# Patient Record
Sex: Male | Born: 1950 | Race: White | Hispanic: No | Marital: Married | Smoking: Current every day smoker
Health system: Southern US, Community
[De-identification: ages and names within clinical notes are randomized; demographics above are authoritative.]

## PROBLEM LIST (undated history)

## (undated) ENCOUNTER — Emergency Department (HOSPITAL_BASED_OUTPATIENT_CLINIC_OR_DEPARTMENT_OTHER): Payer: Self-pay | Source: Home / Self Care

## (undated) DIAGNOSIS — E119 Type 2 diabetes mellitus without complications: Secondary | ICD-10-CM

## (undated) DIAGNOSIS — M1990 Unspecified osteoarthritis, unspecified site: Secondary | ICD-10-CM

## (undated) DIAGNOSIS — G709 Myoneural disorder, unspecified: Secondary | ICD-10-CM

## (undated) DIAGNOSIS — K219 Gastro-esophageal reflux disease without esophagitis: Secondary | ICD-10-CM

## (undated) HISTORY — PX: HERNIA REPAIR: SHX51

## (undated) HISTORY — PX: EYE SURGERY: SHX253

---

## 1999-04-06 ENCOUNTER — Encounter: Payer: Self-pay | Admitting: Internal Medicine

## 1999-04-06 ENCOUNTER — Ambulatory Visit (HOSPITAL_COMMUNITY): Admission: RE | Admit: 1999-04-06 | Discharge: 1999-04-06 | Payer: Self-pay | Admitting: Internal Medicine

## 2001-05-29 ENCOUNTER — Ambulatory Visit (HOSPITAL_COMMUNITY): Admission: RE | Admit: 2001-05-29 | Discharge: 2001-05-29 | Payer: Self-pay | Admitting: Gastroenterology

## 2001-05-29 ENCOUNTER — Encounter (INDEPENDENT_AMBULATORY_CARE_PROVIDER_SITE_OTHER): Payer: Self-pay | Admitting: Specialist

## 2007-10-08 ENCOUNTER — Ambulatory Visit (HOSPITAL_BASED_OUTPATIENT_CLINIC_OR_DEPARTMENT_OTHER): Admission: RE | Admit: 2007-10-08 | Discharge: 2007-10-08 | Payer: Self-pay | Admitting: General Surgery

## 2007-10-08 ENCOUNTER — Encounter (INDEPENDENT_AMBULATORY_CARE_PROVIDER_SITE_OTHER): Payer: Self-pay | Admitting: General Surgery

## 2011-01-02 NOTE — Op Note (Signed)
Christopher Daniel, Christopher Daniel               ACCOUNT NO.:  [id number]   MEDICAL RECORD NO.:  [id number]          PATIENT TYPE:  AMB   LOCATION:  [facility]                         FACILITY:  [facility]   PHYSICIAN:  Anselm Pancoast. Weatherly, M.D.DATE OF BIRTH:  [birth year]   DATE OF PROCEDURE:  10/08/2007  DATE OF DISCHARGE:                               OPERATIVE REPORT   PREOPERATIVE DIAGNOSES:  Left inguinal hernia, probably direct.   POSTOPERATIVE DIAGNOSES:  Left inguinal hernia that is a combination of  direct and indirect.   REFERRING PHYSICIAN:  Joycelyn Rua, M.D.   OPERATION:  Left inguinal herniorrhaphy with mesh reinforcement.   ANESTHESIA:  General anesthesia with LOA tube.   SUBJECTIVE:  Dr. Consuello Bossier.   HISTORY:  Christopher Daniel is a 60 year old, quite tall male who was  referred to me with symptomatic left inguinal hernia. He stated recently  he has had sort of a cold and was doing significant coughing and then  noticed a pain in the left inguinal hernia. He is retired from [employer] and  presently spends most of his time fishing, hunting and other activities  and he said he was pulling a deer in from a hunting expedition when he  started having more significant pain in the left groin. In the office on  exam, you could feel a definite left inguinal hernia, I thought it was  probably a direct hernia. He has also had problems with an anal fissure  for which he is on __________  Xylocaine ointment and that is improving.   Preoperatively the patient was given a gram of Ancef and taken to the  operative suite. An LOA tube was placed and anesthesia, the left groin  had been marked. The time out was identified with the patient,  antibiotics, planned procedure, etc. I had clipped the pubic hair and  then he was prepped with Betadine solution and draped in a sterile  manner. The ilioinguinal area was first infiltrated with a blunted 22  gauge needle with about 10 mL of 0.5% Xylocaine and 0.25  Marcaine with  adrenaline. The inguinal incision area was infiltrated and then the skin  and subcutaneous tissue was opened and there was a large superficial  vein that was clamped, divided and ligated with 4-0 Vicryl. The external  oblique aponeurosis was identified in the external ring area and you  could see the ilioinguinal nerve area. I opened the external oblique  taking care that the ilioinguinal nerve was not stretched or divided and  then elevated the cord structures at the symphysis pubic area. There was  a little bit of fatty tissue preperitoneal that had herniated down. You  could see that this looks like he has got an indirect component as well  as a direct defect. I carefully separated the indirect hernia sac from  the cord structures and opened it. There was a little loop of bowel that  was kind of adherent at the neck of the sac and this was freed and then  a high sac ligation of 2-0 Surgilon was placed just distal to where I  had freed  the bowel and then the hernia sac ligated under direct vision.  The second suture of 2-0 Vicryl was placed just distal and then the  hernia sac was removed and it retracted back up in the preperitoneal  space nicely. The patient has a moderate size direct component and the  floor was closed suturing the shelving edge of the inguinal ligament  with a conjoined tendon area with a running 2-0 Prolene going back to  suturing the 2 ends together recreating the internal ring. The floor was  then anesthetized with the combination of anesthetic solution for  postoperative pain. I then used a piece of Prolene mesh shaped like a  seal, slit laterally to reinforce the floor starting at the symphysis  pubic area, suturing to the inguinal ligament inferior with a running 2-  0 Vicryl and the two tails that been placed around the cord structures  were left large enough that they should not cause any compression. The  superior flap was sutured down with  interrupted sutures of 2-0 Prolene  and the mesh was lying flat not under excessive tension. Next the cord  structures were placed back in the inguinal canal and the external  oblique was closed with a running 2-0 Vicryl. Scarpa's fascia was closed  with interrupted 3-0 Vicryl. There was a little lipoma of the cord that  I separated from the cord structures under direct vision making sure  there was no compromise of the vascular structures. I then closed the  subcuticular with 4-0 Vicryl, Benzoin and Steri-Strips on the skin. The  patient tolerated the procedure nicely and the testicle was in its  normal position. He will be released after a short stay in the recovery  room and I will see him back in the office in approximately 2 weeks.           ______________________________  Anselm Pancoast. Zachery Dakins, M.D.     WJW/MEDQ  D:  10/08/2007  T:  10/09/2007  Job:  [id number]   cc:   Joycelyn Rua, M.D.  Fax: [phone]

## 2011-01-05 NOTE — Procedures (Signed)
[facility]  Patient:    Christopher Daniel, Christopher Daniel Visit Number: [id number] MRN: [id number]          Service Type: END Location: ENDO Attending Physician:  Charna Elizabeth Dictated by:   Anselmo Rod, M.D. Proc. Date: 05/29/01 Admit Date:  05/29/2001   CC:         Lilly Cove, M.D.   Procedure Report  DATE OF BIRTH:  1951-03-10  REFERRING PHYSICIAN:  Lilly Cove, M.D.  PROCEDURE PERFORMED:  Colonoscopy with hot biopsies x 10.  ENDOSCOPIST:  Anselmo Rod, M.D.  INSTRUMENT USED:  Olympus video colonoscope.  INDICATIONS FOR PROCEDURE:  The patient is a 60 year old white male with a history of blood in stool rule out colonic polyps, masses, hemorrhoids, etc.  PREPROCEDURE PREPARATION:  Informed consent was procured from the patient. The patient was fasted for eight hours prior to the procedure and prepped with a bottle of magnesium citrate and a gallon of NuLytely the night prior to the procedure.  PREPROCEDURE PHYSICAL:  The patient had stable vital signs.  Neck supple. Chest clear to auscultation.  S1, S2 regular.  Abdomen soft with normal abdominal bowel sounds.  DESCRIPTION OF PROCEDURE:  The patient was placed in the left lateral decubitus position and sedated with 70 mg of Demerol and 10 mg of Versed intravenously.  Once the patient was adequately sedated and maintained on low-flow oxygen and continuous cardiac monitoring, the Olympus video colonoscope was advanced from the rectum to the cecum without difficulty.  The patient had multiple small sessile polyps throughout the left colon.  These were removed by hot biopsy forceps x 10.  No large masses were seen.  There was no evidence of diverticulosis or hemorrhoids.  The patient tolerated the procedure well without complication.  IMPRESSION: 1. Multiple left-sided colonic polyps (very small sessile polyps) and hot    biopsies done. 2. No large masses seen. 3. No evidence of  diverticulosis.  RECOMMENDATIONS: 1. Await pathology results. 2. Avoid all nonsteroidals for the next two weeks. 3. A high fiber diet. 4. Repeat colorectal cancer screening depending on the pathology results. 5. Outpatient follow-up in the next two weeks. Dictated by:   Anselmo Rod, M.D. Attending Physician:  Charna Elizabeth DD:  05/29/01 TD:  05/29/01 Job: [id number]

## 2011-05-11 LAB — COMPREHENSIVE METABOLIC PANEL
ALT: 19
AST: 15
Albumin: 3.7
Alkaline Phosphatase: 73
BUN: 9
CO2: 27
Calcium: 9.1
Chloride: 104
Creatinine, Ser: 0.97
GFR calc Af Amer: >60
GFR calc non Af Amer: >60
Glucose, Bld: 117 — ABNORMAL HIGH
Potassium: 4
Sodium: 138
Total Bilirubin: 0.7
Total Protein: 6.5

## 2011-05-11 LAB — CBC
HCT: 46.7
Hemoglobin: 15.9
MCHC: 34.1
MCV: 92.3
Platelets: 299
RBC: 5.06
RDW: 14
WBC: 11.6 — ABNORMAL HIGH

## 2011-05-11 LAB — DIFFERENTIAL
Basophils Absolute: 0
Basophils Relative: 0
Eosinophils Absolute: 0.1
Eosinophils Relative: 1
Lymphocytes Relative: 19
Lymphs Abs: 2.2
Monocytes Absolute: 0.3
Monocytes Relative: 3
Neutro Abs: 8.9 — ABNORMAL HIGH
Neutrophils Relative %: 77

## 2013-03-04 ENCOUNTER — Other Ambulatory Visit: Payer: Self-pay | Admitting: Sports Medicine

## 2013-03-04 ENCOUNTER — Ambulatory Visit (INDEPENDENT_AMBULATORY_CARE_PROVIDER_SITE_OTHER): Payer: BC Managed Care – PPO

## 2013-03-04 DIAGNOSIS — M25551 Pain in right hip: Secondary | ICD-10-CM

## 2013-03-04 DIAGNOSIS — M169 Osteoarthritis of hip, unspecified: Secondary | ICD-10-CM

## 2013-03-04 DIAGNOSIS — M25559 Pain in unspecified hip: Secondary | ICD-10-CM

## 2020-02-03 ENCOUNTER — Other Ambulatory Visit: Payer: Self-pay | Admitting: Neurosurgery

## 2020-02-08 NOTE — Progress Notes (Signed)
[facility] [phone] - [place] - [address] [place] [address] Phone: [phone] Fax: [phone]      Your procedure is scheduled on February 11, 2020.  Report to [facility] Main Entrance "A" at 06:00 A.M., and check in at the Admitting office.  Call this number if you have problems the morning of surgery:  [phone]  Call [phone] if you have any questions prior to your surgery date Monday-Friday 8am-4pm   Remember:  Do not eat or drink after midnight the night before your surgery    Take these medicines the morning of surgery with A SIP OF WATER : NONE  As of today, STOP taking any Aspirin (unless otherwise instructed by your surgeon) and Aspirin containing products, Aleve, Naproxen, Ibuprofen, Motrin, Advil, Goody's, BC's, all herbal medications, fish oil, and all vitamins.                     Do not wear jewelry.            Do not wear lotions, powders, colognes, or deodorant.            Do not shave 48 hours prior to surgery.             Do not bring valuables to the hospital.            [facility] is not responsible for any belongings or valuables.  Do NOT Smoke (Tobacco/Vapping) or drink Alcohol 24 hours prior to your procedure If you use a CPAP at night, you may bring all equipment for your overnight stay.   Contacts, glasses, dentures or bridgework may not be worn into surgery.      For patients admitted to the hospital, discharge time will be determined by your treatment team.   Patients discharged the day of surgery will not be allowed to drive home, and someone needs to stay with them for 24 hours.    Special instructions:   [place]- Preparing For Surgery  Before surgery, you can play an important role. Because skin is not sterile, your skin needs to be as free of germs as possible. You can reduce the number of germs on your skin by washing with CHG (chlorahexidine gluconate) Soap before surgery.  CHG is an  antiseptic cleaner which kills germs and bonds with the skin to continue killing germs even after washing.    Oral Hygiene is also important to reduce your risk of infection.  Remember - BRUSH YOUR TEETH THE MORNING OF SURGERY WITH YOUR REGULAR TOOTHPASTE  Please do not use if you have an allergy to CHG or antibacterial soaps. If your skin becomes reddened/irritated stop using the CHG.  Do not shave (including legs and underarms) for at least 48 hours prior to first CHG shower. It is OK to shave your face.  Please follow these instructions carefully.   1. Shower the NIGHT BEFORE SURGERY and the MORNING OF SURGERY with CHG Soap.   2. If you chose to wash your hair, wash your hair first as usual with your normal shampoo.  3. After you shampoo, rinse your hair and body thoroughly to remove the shampoo.  4. Use CHG as you would any other liquid soap. You can apply CHG directly to the skin and wash gently with a scrungie or a clean washcloth.   5. Apply the CHG Soap to your body ONLY FROM THE NECK DOWN.  Do not use on open wounds  or open sores. Avoid contact with your eyes, ears, mouth and genitals (private parts). Wash Face and genitals (private parts)  with your normal soap.   6. Wash thoroughly, paying special attention to the area where your surgery will be performed.  7. Thoroughly rinse your body with warm water from the neck down.  8. DO NOT shower/wash with your normal soap after using and rinsing off the CHG Soap.  9. Pat yourself dry with a CLEAN TOWEL.  10. Wear CLEAN PAJAMAS to bed the night before surgery, wear comfortable clothes the morning of surgery  11. Place CLEAN SHEETS on your bed the night of your first shower and DO NOT SLEEP WITH PETS.   Day of Surgery:   Do not apply any deodorants/lotions.  Please wear clean clothes to the hospital/surgery center.   Remember to brush your teeth WITH YOUR REGULAR TOOTHPASTE.   Please read over the following fact sheets that  you were given.

## 2020-02-09 ENCOUNTER — Encounter (HOSPITAL_COMMUNITY)
Admission: RE | Admit: 2020-02-09 | Discharge: 2020-02-09 | Disposition: A | Discharge status: Home / Self Care | Payer: Medicare HMO | Source: Ambulatory Visit | Attending: Neurosurgery | Admitting: Neurosurgery

## 2020-02-09 ENCOUNTER — Other Ambulatory Visit: Payer: Self-pay

## 2020-02-09 ENCOUNTER — Encounter (HOSPITAL_COMMUNITY): Payer: Self-pay

## 2020-02-09 ENCOUNTER — Other Ambulatory Visit (HOSPITAL_COMMUNITY)
Admission: RE | Admit: 2020-02-09 | Discharge: 2020-02-09 | Disposition: A | Discharge status: Home / Self Care | Payer: Medicare HMO | Source: Ambulatory Visit | Attending: Neurosurgery | Admitting: Neurosurgery

## 2020-02-09 DIAGNOSIS — Z01818 Encounter for other preprocedural examination: Secondary | ICD-10-CM | POA: Diagnosis present

## 2020-02-09 DIAGNOSIS — Z20822 Contact with and (suspected) exposure to covid-19: Secondary | ICD-10-CM | POA: Insufficient documentation

## 2020-02-09 HISTORY — DX: Gastro-esophageal reflux disease without esophagitis: K21.9

## 2020-02-09 HISTORY — DX: Type 2 diabetes mellitus without complications: E11.9

## 2020-02-09 HISTORY — DX: Myoneural disorder, unspecified: G70.9

## 2020-02-09 HISTORY — DX: Unspecified osteoarthritis, unspecified site: M19.90

## 2020-02-09 LAB — BASIC METABOLIC PANEL
Anion gap: 10 (ref 5–15)
BUN: 13 mg/dL (ref 8–23)
CO2: 20 mmol/L — ABNORMAL LOW (ref 22–32)
Calcium: 9.2 mg/dL (ref 8.9–10.3)
Chloride: 107 mmol/L (ref 98–111)
Creatinine, Ser: 0.82 mg/dL (ref 0.61–1.24)
GFR calc Af Amer: >60 mL/min (ref >60)
GFR calc non Af Amer: >60 mL/min (ref >60)
Glucose, Bld: 111 mg/dL — ABNORMAL HIGH (ref 70–99)
Potassium: 4 mmol/L (ref 3.5–5.1)
Sodium: 137 mmol/L (ref 135–145)

## 2020-02-09 LAB — SURGICAL PCR SCREEN
MRSA, PCR: NEGATIVE
Staphylococcus aureus: NEGATIVE

## 2020-02-09 LAB — CBC WITH DIFFERENTIAL/PLATELET
Abs Immature Granulocytes: 0.03 10*3/uL (ref 0.00–0.07)
Basophils Absolute: 0 10*3/uL (ref 0.0–0.1)
Basophils Relative: 1 %
Eosinophils Absolute: 0.1 10*3/uL (ref 0.0–0.5)
Eosinophils Relative: 1 %
HCT: 46.5 % (ref 39.0–52.0)
Hemoglobin: 15.3 g/dL (ref 13.0–17.0)
Immature Granulocytes: 0 %
Lymphocytes Relative: 18 %
Lymphs Abs: 1.6 10*3/uL (ref 0.7–4.0)
MCH: 31.3 pg (ref 26.0–34.0)
MCHC: 32.9 g/dL (ref 30.0–36.0)
MCV: 95.1 fL (ref 80.0–100.0)
Monocytes Absolute: 0.5 10*3/uL (ref 0.1–1.0)
Monocytes Relative: 5 %
Neutro Abs: 6.7 10*3/uL (ref 1.7–7.7)
Neutrophils Relative %: 75 %
Platelets: 273 10*3/uL (ref 150–400)
RBC: 4.89 MIL/uL (ref 4.22–5.81)
RDW: 13.7 % (ref 11.5–15.5)
WBC: 8.9 10*3/uL (ref 4.0–10.5)
nRBC: 0 % (ref 0.0–0.2)

## 2020-02-09 LAB — SARS CORONAVIRUS 2 (TAT 6-24 HRS): SARS Coronavirus 2: NEGATIVE

## 2020-02-09 NOTE — Progress Notes (Signed)
PCP -  Lindley Magnus, PA-C Cardiologist - denies; has seen [facility] in past  Chest x-ray - N/A EKG - 02/09/20 Stress Test - 2015- [facility].- records requested ECHO - 2015- [facility] records requested Cardiac Cath - denies  Sleep Study - denies CPAP -   Fasting Blood Sugar - does not check CBG or take any medications for history of type 2 DM  Aspirin Instructions: Patient instructed to hold all Aspirin, NSAID's, herbal medications, fish oil and vitamins 7 days prior to surgery.   ERAS Protcol -N/A PRE-SURGERY Ensure or G2- N/A  COVID TEST- 02/09/20 at [facility]. Pt instructed to remain in their car. Educated on Haematologist until [employer]    Anesthesia review: records requested  Patient denies shortness of breath, fever, cough and chest pain at PAT appointment   All instructions explained to the patient, with a verbal understanding of the material. Patient agrees to go over the instructions while at home for a better understanding. Patient also instructed to self quarantine after being tested for COVID-19. The opportunity to ask questions was provided.

## 2020-02-10 NOTE — Progress Notes (Signed)
Anesthesia Chart Review:  Case: [id number] Date/Time: 02/11/20 0745   Procedure: Laminectomy for facet/synovial cyst - right - L5-S1 (Right Back) - 3C   Anesthesia type: General   Pre-op diagnosis: Synovial cyst   Location: [facility] OR ROOM 21 / [facility] OR   Surgeons: Julio Sicks, MD      DISCUSSION: Patient is a 69 year old male scheduled for the above procedure.  History includes smoking, DM2 (diet controlled), polyneuropathy, GERD.  Preoperative COVID-19 test negative on 02/09/2020.  Anesthesia team to evaluate on the day of surgery.   VS: BP 132/65   Pulse 75   Temp 37.2 C (Oral)   Resp 17   Ht 6\' 5"  (1.956 m)   Wt 93.6 kg   SpO2 97%   BMI 24.46 kg/m    PROVIDERS: , PA-C is listed as PCP. He had a visit on 01/04/20 with 01/06/20, PA-C for annual visit.  - He is not followed routinely by cardiology, but was evaluated by Betsey Holiday, MD in 2016 for chest pain and had a normal stress echo. As needed follow-up. (See [facility] Everywhere)   LABS: Labs reviewed: Acceptable for surgery. A1c 6.7% on 01/04/20 [facility] Everywhere). (all labs ordered are listed, but only abnormal results are displayed)  Labs Reviewed  BASIC METABOLIC PANEL - Abnormal; Notable for the following components:      Result Value   CO2 20 (*)    Glucose, Bld 111 (*)    All other components within normal limits  SURGICAL PCR SCREEN  CBC WITH DIFFERENTIAL/PLATELET    EKG: 02/09/20: NSR   CV: 02/11/20 Abdominal Aorta 03/10/19 ([facility]): IMPRESSION:  No evidence of aortic aneurysm   Stress Echo 10/19/14 ([facility]):  Summary: The study was technically good.  This was a normal stress echocardiogram.  No inducible ischemia.  High workload achieved.  Normal resting wall motion and no stress-induced wall motion abnormality.  Overall LV ejection fraction increased appropriately after stress.  Stress terminated secondary to achieving diagnostic endpoint with 91% Max peak heart rate achieved.  This  is an adequate stress test. Post exercise ECG demonstrated no new ST segment depression. The left ventricle is normal in size, wall thickness and wall motion with ejection fraction of 55 to 60%. The tricuspid valve leaflets are structurally normal with trace regurgitation. Mild aortic sclerosis is present with good valvular opening.   No inducible ischemia.   Past Medical History:  Diagnosis Date  . Arthritis   . Diabetes mellitus without complication (HCC)    does not take medication or check sugars  . GERD (gastroesophageal reflux disease)   . Neuromuscular disorder (HCC)    poly neuropathy    Past Surgical History:  Procedure Laterality Date  . EYE SURGERY Bilateral    eyelid  . HERNIA REPAIR      MEDICATIONS: . ibuprofen (ADVIL) 200 MG tablet  . TURMERIC PO   No current facility-administered medications for this encounter.    12/19/14, PA-C Surgical Short Stay/Anesthesiology [facility] Phone [phone] [facility] Phone [phone] 02/10/2020 11:15 AM

## 2020-02-10 NOTE — Anesthesia Preprocedure Evaluation (Addendum)
Anesthesia Evaluation  Patient identified by MRN, date of birth, ID band Patient awake    Reviewed: Allergy & Precautions, H&P , NPO status , Patient's Chart, lab work & pertinent test results  Airway Mallampati: II   Neck ROM: full    Dental  (+) Dental Advisory Given, Teeth Intact, Caps   Pulmonary Current Smoker,    breath sounds clear to auscultation       Cardiovascular negative cardio ROS   Rhythm:regular Rate:Normal     Neuro/Psych  Neuromuscular disease    GI/Hepatic GERD  ,  Endo/Other  diabetes, Type 2  Renal/GU      Musculoskeletal  (+) Arthritis ,   Abdominal   Peds  Hematology   Anesthesia Other Findings   Reproductive/Obstetrics                           Anesthesia Physical Anesthesia Plan  ASA: II  Anesthesia Plan: General   Post-op Pain Management:    Induction: Intravenous  PONV Risk Score and Plan: 1 and Ondansetron, Dexamethasone and Treatment may vary due to age or medical condition  Airway Management Planned: Oral ETT  Additional Equipment:   Intra-op Plan:   Post-operative Plan: Extubation in OR  Informed Consent: I have reviewed the patients History and Physical, chart, labs and discussed the procedure including the risks, benefits and alternatives for the proposed anesthesia with the patient or authorized representative who has indicated his/her understanding and acceptance.       Plan Discussed with: CRNA, Anesthesiologist and Surgeon  Anesthesia Plan Comments: (PAT note written 02/10/2020 by Shonna Chock, PA-C. )       Anesthesia Quick Evaluation

## 2020-02-11 ENCOUNTER — Observation Stay (HOSPITAL_COMMUNITY)
Admission: RE | Admit: 2020-02-11 | Discharge: 2020-02-11 | Disposition: A | Discharge status: Home / Self Care | Payer: Medicare HMO | Attending: Neurosurgery | Admitting: Neurosurgery

## 2020-02-11 ENCOUNTER — Encounter (HOSPITAL_COMMUNITY): Payer: Self-pay | Admitting: Neurosurgery

## 2020-02-11 ENCOUNTER — Ambulatory Visit (HOSPITAL_COMMUNITY): Payer: Medicare HMO | Admitting: Certified Registered"

## 2020-02-11 ENCOUNTER — Ambulatory Visit (HOSPITAL_COMMUNITY): Payer: Medicare HMO

## 2020-02-11 ENCOUNTER — Ambulatory Visit (HOSPITAL_COMMUNITY): Payer: Medicare HMO | Admitting: Emergency Medicine

## 2020-02-11 ENCOUNTER — Encounter (HOSPITAL_COMMUNITY)
Admission: RE | Disposition: A | Discharge status: Home / Self Care | Payer: Self-pay | Source: Home / Self Care | Attending: Neurosurgery

## 2020-02-11 ENCOUNTER — Other Ambulatory Visit: Payer: Self-pay

## 2020-02-11 DIAGNOSIS — K219 Gastro-esophageal reflux disease without esophagitis: Secondary | ICD-10-CM | POA: Insufficient documentation

## 2020-02-11 DIAGNOSIS — M5417 Radiculopathy, lumbosacral region: Secondary | ICD-10-CM | POA: Diagnosis not present

## 2020-02-11 DIAGNOSIS — F17210 Nicotine dependence, cigarettes, uncomplicated: Secondary | ICD-10-CM | POA: Insufficient documentation

## 2020-02-11 DIAGNOSIS — M7138 Other bursal cyst, other site: Secondary | ICD-10-CM | POA: Diagnosis not present

## 2020-02-11 DIAGNOSIS — E119 Type 2 diabetes mellitus without complications: Secondary | ICD-10-CM | POA: Diagnosis not present

## 2020-02-11 DIAGNOSIS — M1990 Unspecified osteoarthritis, unspecified site: Secondary | ICD-10-CM | POA: Insufficient documentation

## 2020-02-11 DIAGNOSIS — Z419 Encounter for procedure for purposes other than remedying health state, unspecified: Secondary | ICD-10-CM

## 2020-02-11 HISTORY — PX: LUMBAR LAMINECTOMY/DECOMPRESSION MICRODISCECTOMY: SHX5026

## 2020-02-11 LAB — GLUCOSE, CAPILLARY
Glucose-Capillary: 135 mg/dL — ABNORMAL HIGH (ref 70–99)
Glucose-Capillary: 134 mg/dL — ABNORMAL HIGH (ref 70–99)
Glucose-Capillary: 134 mg/dL — ABNORMAL HIGH (ref 70–99)
Glucose-Capillary: 162 mg/dL — ABNORMAL HIGH (ref 70–99)

## 2020-02-11 SURGERY — LUMBAR LAMINECTOMY/DECOMPRESSION MICRODISCECTOMY 1 LEVEL
Anesthesia: General | Site: Back | Laterality: Right

## 2020-02-11 MED ORDER — FENTANYL CITRATE (PF) 100 MCG/2ML IJ SOLN
INTRAMUSCULAR | Status: DC | PRN
  Administered 2020-02-11: 100 ug via INTRAVENOUS
  Administered 2020-02-11: 50 ug via INTRAVENOUS

## 2020-02-11 MED ORDER — OXYCODONE HCL 5 MG PO TABS
5.0000 mg | ORAL_TABLET | Freq: Once | ORAL | Status: AC | PRN
  Administered 2020-02-11: 5 mg via ORAL

## 2020-02-11 MED ORDER — SODIUM CHLORIDE 0.9 % IV SOLN: 250.0000 mL | INTRAVENOUS | Status: DC

## 2020-02-11 MED ORDER — PHENOL 1.4 % MT LIQD: 1.0000 | OROMUCOSAL | Status: DC | PRN

## 2020-02-11 MED ORDER — MENTHOL 3 MG MT LOZG: 1.0000 | LOZENGE | OROMUCOSAL | Status: DC | PRN

## 2020-02-11 MED ORDER — ROCURONIUM BROMIDE 10 MG/ML (PF) SYRINGE
PREFILLED_SYRINGE | INTRAVENOUS | Status: AC
  Filled 2020-02-11: qty 10

## 2020-02-11 MED ORDER — SODIUM CHLORIDE 0.9% FLUSH: 3.0000 mL | Freq: Two times a day (BID) | INTRAVENOUS | Status: DC

## 2020-02-11 MED ORDER — HYDROMORPHONE HCL 1 MG/ML IJ SOLN: 1.0000 mg | INTRAMUSCULAR | Status: DC | PRN

## 2020-02-11 MED ORDER — LIDOCAINE 2% (20 MG/ML) 5 ML SYRINGE
INTRAMUSCULAR | Status: AC
  Filled 2020-02-11: qty 5

## 2020-02-11 MED ORDER — ONDANSETRON HCL 4 MG/2ML IJ SOLN
INTRAMUSCULAR | Status: DC | PRN
  Administered 2020-02-11: 4 mg via INTRAVENOUS

## 2020-02-11 MED ORDER — OXYCODONE HCL 5 MG PO TABS
ORAL_TABLET | ORAL | Status: AC
  Filled 2020-02-11: qty 1

## 2020-02-11 MED ORDER — SODIUM CHLORIDE 0.9% FLUSH: 3.0000 mL | INTRAVENOUS | Status: DC | PRN

## 2020-02-11 MED ORDER — PHENYLEPHRINE HCL (PRESSORS) 10 MG/ML IV SOLN
INTRAVENOUS | Status: DC | PRN
Start: 2020-02-11 — End: 2020-02-11
  Administered 2020-02-11: 40 ug via INTRAVENOUS

## 2020-02-11 MED ORDER — LIDOCAINE 2% (20 MG/ML) 5 ML SYRINGE
INTRAMUSCULAR | Status: DC | PRN
  Administered 2020-02-11: 80 mg via INTRAVENOUS

## 2020-02-11 MED ORDER — THROMBIN 5000 UNITS EX SOLR
CUTANEOUS | Status: AC
  Filled 2020-02-11: qty 10000

## 2020-02-11 MED ORDER — SODIUM CHLORIDE 0.9 % IV SOLN
INTRAVENOUS | Status: DC | PRN
  Administered 2020-02-11: 500 mL

## 2020-02-11 MED ORDER — HYDROCODONE-ACETAMINOPHEN 10-325 MG PO TABS
2.0000 | ORAL_TABLET | ORAL | Status: DC | PRN
  Administered 2020-02-11: 2 via ORAL
  Filled 2020-02-11: qty 2

## 2020-02-11 MED ORDER — ONDANSETRON HCL 4 MG PO TABS: 4.0000 mg | ORAL_TABLET | Freq: Four times a day (QID) | ORAL | Status: DC | PRN

## 2020-02-11 MED ORDER — CHLORHEXIDINE GLUCONATE CLOTH 2 % EX PADS: 6.0000 | MEDICATED_PAD | Freq: Once | CUTANEOUS | Status: DC

## 2020-02-11 MED ORDER — ONDANSETRON HCL 4 MG/2ML IJ SOLN: 4.0000 mg | Freq: Four times a day (QID) | INTRAMUSCULAR | Status: DC | PRN

## 2020-02-11 MED ORDER — CEFAZOLIN SODIUM-DEXTROSE 1-4 GM/50ML-% IV SOLN
1.0000 g | Freq: Three times a day (TID) | INTRAVENOUS | Status: DC
  Administered 2020-02-11: 1 g via INTRAVENOUS
  Filled 2020-02-11: qty 50

## 2020-02-11 MED ORDER — CEFAZOLIN SODIUM-DEXTROSE 2-4 GM/100ML-% IV SOLN
2.0000 g | INTRAVENOUS | Status: DC
  Filled 2020-02-11: qty 100

## 2020-02-11 MED ORDER — 0.9 % SODIUM CHLORIDE (POUR BTL) OPTIME
TOPICAL | Status: DC | PRN
  Administered 2020-02-11: 1000 mL

## 2020-02-11 MED ORDER — DEXAMETHASONE SODIUM PHOSPHATE 10 MG/ML IJ SOLN
INTRAMUSCULAR | Status: AC
  Filled 2020-02-11: qty 1

## 2020-02-11 MED ORDER — PROPOFOL 10 MG/ML IV BOLUS
INTRAVENOUS | Status: DC | PRN
  Administered 2020-02-11: 150 mg via INTRAVENOUS

## 2020-02-11 MED ORDER — KETOROLAC TROMETHAMINE 15 MG/ML IJ SOLN
30.0000 mg | Freq: Four times a day (QID) | INTRAMUSCULAR | Status: DC
  Administered 2020-02-11 (×2): 30 mg via INTRAVENOUS
  Filled 2020-02-11 (×2): qty 2

## 2020-02-11 MED ORDER — CHLORHEXIDINE GLUCONATE 0.12 % MT SOLN
15.0000 mL | Freq: Once | OROMUCOSAL | Status: AC
  Administered 2020-02-11: 15 mL via OROMUCOSAL
  Filled 2020-02-11: qty 15

## 2020-02-11 MED ORDER — PROPOFOL 10 MG/ML IV BOLUS
INTRAVENOUS | Status: AC
  Filled 2020-02-11: qty 20

## 2020-02-11 MED ORDER — ORAL CARE MOUTH RINSE: 15.0000 mL | Freq: Once | OROMUCOSAL | Status: AC

## 2020-02-11 MED ORDER — TURMERIC 500 MG PO TABS: ORAL_TABLET | Freq: Every day | ORAL | Status: DC

## 2020-02-11 MED ORDER — HYDROCODONE-ACETAMINOPHEN 5-325 MG PO TABS: 1.0000 | ORAL_TABLET | ORAL | Status: DC | PRN

## 2020-02-11 MED ORDER — KETOROLAC TROMETHAMINE 30 MG/ML IJ SOLN
INTRAMUSCULAR | Status: DC | PRN
  Administered 2020-02-11: 30 mg via INTRAVENOUS

## 2020-02-11 MED ORDER — PHENYLEPHRINE 40 MCG/ML (10ML) SYRINGE FOR IV PUSH (FOR BLOOD PRESSURE SUPPORT)
PREFILLED_SYRINGE | INTRAVENOUS | Status: AC
  Filled 2020-02-11: qty 10

## 2020-02-11 MED ORDER — BUPIVACAINE HCL (PF) 0.25 % IJ SOLN
INTRAMUSCULAR | Status: AC
  Filled 2020-02-11: qty 30

## 2020-02-11 MED ORDER — FENTANYL CITRATE (PF) 100 MCG/2ML IJ SOLN: 25.0000 ug | INTRAMUSCULAR | Status: DC | PRN

## 2020-02-11 MED ORDER — ONDANSETRON HCL 4 MG/2ML IJ SOLN
INTRAMUSCULAR | Status: AC
  Filled 2020-02-11: qty 2

## 2020-02-11 MED ORDER — CEFAZOLIN SODIUM-DEXTROSE 2-3 GM-%(50ML) IV SOLR
INTRAVENOUS | Status: DC | PRN
Start: 2020-02-11 — End: 2020-02-11
  Administered 2020-02-11: 2 g via INTRAVENOUS

## 2020-02-11 MED ORDER — FENTANYL CITRATE (PF) 250 MCG/5ML IJ SOLN
INTRAMUSCULAR | Status: AC
  Filled 2020-02-11: qty 5

## 2020-02-11 MED ORDER — CYCLOBENZAPRINE HCL 10 MG PO TABS
10.0000 mg | ORAL_TABLET | Freq: Three times a day (TID) | ORAL | Status: DC | PRN
  Administered 2020-02-11: 10 mg via ORAL
  Filled 2020-02-11: qty 1

## 2020-02-11 MED ORDER — ROCURONIUM BROMIDE 10 MG/ML (PF) SYRINGE
PREFILLED_SYRINGE | INTRAVENOUS | Status: DC | PRN
  Administered 2020-02-11: 60 mg via INTRAVENOUS

## 2020-02-11 MED ORDER — ACETAMINOPHEN 650 MG RE SUPP: 650.0000 mg | RECTAL | Status: DC | PRN

## 2020-02-11 MED ORDER — LACTATED RINGERS IV SOLN: INTRAVENOUS | Status: DC | PRN

## 2020-02-11 MED ORDER — THROMBIN 5000 UNITS EX SOLR
CUTANEOUS | Status: DC | PRN
  Administered 2020-02-11 (×2): 5000 [IU] via TOPICAL

## 2020-02-11 MED ORDER — OXYCODONE HCL 5 MG/5ML PO SOLN: 5.0000 mg | Freq: Once | ORAL | Status: AC | PRN

## 2020-02-11 MED ORDER — SUGAMMADEX SODIUM 200 MG/2ML IV SOLN
INTRAVENOUS | Status: DC | PRN
  Administered 2020-02-11: 200 mg via INTRAVENOUS

## 2020-02-11 MED ORDER — DEXAMETHASONE SODIUM PHOSPHATE 10 MG/ML IJ SOLN
10.0000 mg | Freq: Once | INTRAMUSCULAR | Status: AC
  Administered 2020-02-11: 10 mg via INTRAVENOUS
  Filled 2020-02-11: qty 1

## 2020-02-11 MED ORDER — ACETAMINOPHEN 325 MG PO TABS: 650.0000 mg | ORAL_TABLET | ORAL | Status: DC | PRN

## 2020-02-11 MED ORDER — HYDROCODONE-ACETAMINOPHEN 5-325 MG PO TABS: 1.0000 | ORAL_TABLET | ORAL | 0 refills | Status: AC | PRN

## 2020-02-11 MED ORDER — BUPIVACAINE HCL (PF) 0.25 % IJ SOLN
INTRAMUSCULAR | Status: DC | PRN
  Administered 2020-02-11: 10 mL

## 2020-02-11 SURGICAL SUPPLY — 49 items
BAG DECANTER FOR FLEXI CONT (MISCELLANEOUS) ×3 IMPLANT
BAND RUBBER #18 3X1/16 STRL (MISCELLANEOUS) ×6 IMPLANT
BENZOIN TINCTURE PRP APPL 2/3 (GAUZE/BANDAGES/DRESSINGS) ×3 IMPLANT
BLADE CLIPPER SURG (BLADE) IMPLANT
BUR CUTTER 7.0 ROUND (BURR) ×3 IMPLANT
CANISTER SUCT 3000ML PPV (MISCELLANEOUS) ×3 IMPLANT
CARTRIDGE OIL MAESTRO DRILL (MISCELLANEOUS) ×1 IMPLANT
CLOSURE WOUND 1/2 X4 (GAUZE/BANDAGES/DRESSINGS) ×1
COVER WAND RF STERILE (DRAPES) ×3 IMPLANT
DECANTER SPIKE VIAL GLASS SM (MISCELLANEOUS) ×3 IMPLANT
DERMABOND ADVANCED (GAUZE/BANDAGES/DRESSINGS) ×2
DERMABOND ADVANCED .7 DNX12 (GAUZE/BANDAGES/DRESSINGS) ×1 IMPLANT
DIFFUSER DRILL AIR PNEUMATIC (MISCELLANEOUS) ×3 IMPLANT
DRAPE HALF SHEET 40X57 (DRAPES) IMPLANT
DRAPE LAPAROTOMY 100X72X124 (DRAPES) ×3 IMPLANT
DRAPE MICROSCOPE LEICA (MISCELLANEOUS) ×3 IMPLANT
DRAPE SURG 17X23 STRL (DRAPES) ×6 IMPLANT
DRSG OPSITE POSTOP 3X4 (GAUZE/BANDAGES/DRESSINGS) ×3 IMPLANT
DURAPREP 26ML APPLICATOR (WOUND CARE) ×3 IMPLANT
ELECT REM PT RETURN 9FT ADLT (ELECTROSURGICAL) ×3
ELECTRODE REM PT RTRN 9FT ADLT (ELECTROSURGICAL) ×1 IMPLANT
GAUZE 4X4 16PLY RFD (DISPOSABLE) IMPLANT
GAUZE SPONGE 4X4 12PLY STRL (GAUZE/BANDAGES/DRESSINGS) ×3 IMPLANT
GLOVE BIO SURGEON STRL SZ 6.5 (GLOVE) ×2 IMPLANT
GLOVE BIO SURGEONS STRL SZ 6.5 (GLOVE) ×1
GLOVE BIOGEL PI IND STRL 6.5 (GLOVE) ×1 IMPLANT
GLOVE BIOGEL PI INDICATOR 6.5 (GLOVE) ×2
GLOVE ECLIPSE 9.0 STRL (GLOVE) ×3 IMPLANT
GLOVE EXAM NITRILE XL STR (GLOVE) IMPLANT
GOWN STRL REUS W/ TWL LRG LVL3 (GOWN DISPOSABLE) IMPLANT
GOWN STRL REUS W/ TWL XL LVL3 (GOWN DISPOSABLE) ×1 IMPLANT
GOWN STRL REUS W/TWL 2XL LVL3 (GOWN DISPOSABLE) IMPLANT
GOWN STRL REUS W/TWL LRG LVL3 (GOWN DISPOSABLE)
GOWN STRL REUS W/TWL XL LVL3 (GOWN DISPOSABLE) ×3
KIT BASIN OR (CUSTOM PROCEDURE TRAY) ×3 IMPLANT
KIT TURNOVER KIT B (KITS) ×3 IMPLANT
NEEDLE HYPO 22GX1.5 SAFETY (NEEDLE) ×3 IMPLANT
NEEDLE SPNL 22GX3.5 QUINCKE BK (NEEDLE) IMPLANT
NS IRRIG 1000ML POUR BTL (IV SOLUTION) ×3 IMPLANT
OIL CARTRIDGE MAESTRO DRILL (MISCELLANEOUS) ×3
PACK LAMINECTOMY NEURO (CUSTOM PROCEDURE TRAY) ×3 IMPLANT
PAD ARMBOARD 7.5X6 YLW CONV (MISCELLANEOUS) ×9 IMPLANT
SPONGE SURGIFOAM ABS GEL SZ50 (HEMOSTASIS) ×3 IMPLANT
STRIP CLOSURE SKIN 1/2X4 (GAUZE/BANDAGES/DRESSINGS) ×2 IMPLANT
SUT VIC AB 2-0 CT1 18 (SUTURE) ×3 IMPLANT
SUT VIC AB 3-0 SH 8-18 (SUTURE) ×3 IMPLANT
TOWEL GREEN STERILE (TOWEL DISPOSABLE) ×3 IMPLANT
TOWEL GREEN STERILE FF (TOWEL DISPOSABLE) ×3 IMPLANT
WATER STERILE IRR 1000ML POUR (IV SOLUTION) ×3 IMPLANT

## 2020-02-11 NOTE — Brief Op Note (Signed)
02/11/2020  9:13 AM  PATIENT:  Christopher Daniel  69 y.o. male  PRE-OPERATIVE DIAGNOSIS:  Synovial cyst  POST-OPERATIVE DIAGNOSIS:  Synovial cyst  PROCEDURE:  Procedure(s) with comments: Laminectomy for facet/synovial cyst - right - Lumbar Five-Sacral One (Right) - Laminectomy for facet/synovial cyst - right - Lumbar Five-Sacral One  SURGEON:  Surgeon(s) and Role:    * Julio Sicks, MD - Primary  PHYSICIAN ASSISTANT:   ASSISTANTS: Bergman<NP   ANESTHESIA:   general  EBL:  Minimal   BLOOD ADMINISTERED:none  DRAINS: none   LOCAL MEDICATIONS USED:  MARCAINE     SPECIMEN:  No Specimen  DISPOSITION OF SPECIMEN:  N/A  COUNTS:  YES  TOURNIQUET:  * No tourniquets in log *  DICTATION: .Dragon Dictation  PLAN OF CARE: Admit for overnight observation  PATIENT DISPOSITION:  PACU - hemodynamically stable.   Delay start of Pharmacological VTE agent (>24hrs) due to surgical blood loss or risk of bleeding: yes

## 2020-02-11 NOTE — Progress Notes (Signed)
Patient is discharged from room 3C02 at this time. Alert and in stable condition. IV site d/c'd and instructions read to patient and spouse with understanding verbalized and all questions answered. Left unit via wheelchair with all belongings at side.  ?

## 2020-02-11 NOTE — Anesthesia Postprocedure Evaluation (Signed)
Anesthesia Post Note  Patient: Christopher Daniel  Procedure(s) Performed: Laminectomy for facet/synovial cyst - right - Lumbar Five-Sacral One (Right Back)     Patient location during evaluation: PACU Anesthesia Type: General Level of consciousness: awake and alert Pain management: pain level controlled Vital Signs Assessment: post-procedure vital signs reviewed and stable Respiratory status: spontaneous breathing, nonlabored ventilation, respiratory function stable and patient connected to nasal cannula oxygen Cardiovascular status: blood pressure returned to baseline and stable Postop Assessment: no apparent nausea or vomiting Anesthetic complications: no   No complications documented.  Last Vitals:  Vitals:   02/11/20 1041 02/11/20 1531  BP: 128/77 128/69  Pulse: (!) 55 (!) 58  Resp: 18 19  Temp: 36.6 C 36.8 C  SpO2: 100% 98%    Last Pain:  Vitals:   02/11/20 1531  TempSrc: Oral  PainSc:                  Carleton [place] S

## 2020-02-11 NOTE — H&P (Signed)
  Christopher Daniel is an 69 y.o. male.   Chief Complaint: Right leg pain  HPI: 69 year old male with persistent right lower extremity radicular pain consistent with Christopher right-sided S1 radiculopathy which is failed conservative management her work-up demonstrates evidence of lumbar spondylosis with facet arthropathy and associated right-sided L5-S1 synovial cyst causing compression of his right S1 nerve root.  Patient presents now for decompression and resection of synovial cyst.  Past Medical History:  Diagnosis Date  . Arthritis   . Diabetes mellitus without complication (HCC)    does not take medication or check sugars  . GERD (gastroesophageal reflux disease)   . Neuromuscular disorder (HCC)    poly neuropathy    Past Surgical History:  Procedure Laterality Date  . EYE SURGERY Bilateral    eyelid  . HERNIA REPAIR      History reviewed. No pertinent family history. Social History:  reports that he has been smoking cigarettes. He has never used smokeless tobacco. He reports current alcohol use. He reports that he does not use drugs.  Allergies: No Known Allergies  Medications Prior to Admission  Medication Sig Dispense Refill  . ibuprofen (ADVIL) 200 MG tablet Take 400-600 mg by mouth every 8 (eight) hours as needed (for pain.).    Marland Kitchen TURMERIC PO Take 1 capsule by mouth daily.      Results for orders placed or performed during the hospital encounter of 02/11/20 (from the past 48 hour(s))  Glucose, capillary     Status: Abnormal   Collection Time: 02/11/20  6:21 AM  Result Value Ref Range   Glucose-Capillary 135 (H) 70 - 99 mg/dL    Comment: Glucose reference range applies only to samples taken after fasting for at least 8 hours.   No results found.  Pertinent items noted in HPI and remainder of comprehensive ROS otherwise negative.  Blood pressure 130/73, pulse 67, temperature 97.6 F (36.4 C), temperature source Temporal, resp. rate 18, height 6\' 5"  (1.956 m), weight 93.6 kg,  SpO2 100 %.  Patient is awake and alert.  He is oriented and appropriate.  Speech is fluent.  Judgment insight are intact.  Cranial nerve function normal bilaterally.  Motor examination reveals decreased strength in his right gastrocnemius muscle grading at 4-/5.  He has decreased sensation in his right S1 dermatome.  Reflexes are normal active except his right Achilles reflexes absent.  No evidence of long track signs.  Gait is antalgic.  Posture is normal.  Examination head ears eyes nose throat [place] chest and abdomen benign.  Extremities free from injury or deformity. Assessment/Plan Right L5-S1 synovial cyst with radiculopathy.  Plan right-sided L5-S1 laminotomy and resection of synovial cyst.  Risks and benefits been explained.  Patient wishes to proceed.  Christopher Daniel 02/11/2020, 7:57 AM

## 2020-02-11 NOTE — Op Note (Signed)
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  Asst. Surgeon: Doran Durand, NP  Anesthesia: General  Indication: 69 year old male with right lower extremity pain paresthesias and weakness consistent with a right-sided S1 radiculopathy which is failed conservative manage.  Work-up demonstrates evidence of a focal right-sided L5-S1 synovial cyst with marked compression of the right S1 nerve root.  Patient presents now for laminotomy and resection of cyst in hopes of improving his symptoms.  Operative note: After induction of anesthesia, patient position prone onto Wilson frame properly padded.  Lumbar region prepped and draped sterilely.  Incision made overlying L5-S1.  Dissection performed on the right.  Retractor placed.  X-ray taken.  Level confirmed.  Laminotomy then performed using high-speed drill and Kerrison Rogers.  Ligament flavum elevated and resected.  Microscope brought in field used micro section of the spinal canal.  Thecal sac and right-sided S1 nerve root were identified.  The synovial cyst was dissected free circumferentially and resected.  Foraminotomies completed on the course the exiting S1 and L5 nerve roots.  No evidence of any residual compression.  No evidence of any residual cyst.  The space inspected and found to be flat without evidence of annular injury.  Wound was then irrigated with antibiotic solution.  Gelfoam was placed topically for hemostasis.  Wound is then closed in layers with Vicryl sutures.  Steri-Strips and sterile dressing were applied.  No apparent complications.  Patient tolerated the procedure well and he returns to recovery and postop.

## 2020-02-11 NOTE — Transfer of Care (Signed)
Immediate Anesthesia Transfer of Care Note  Patient: Christopher Daniel  Procedure(s) Performed: Laminectomy for facet/synovial cyst - right - Lumbar Five-Sacral One (Right Back)  Patient Location: PACU  Anesthesia Type:General  Level of Consciousness: awake, alert  and oriented  Airway & Oxygen Therapy: Patient connected to face mask oxygen  Post-op Assessment: Post -op Vital signs reviewed and stable  Post vital signs: stable  Last Vitals:  Vitals Value Taken Time  BP    Temp    Pulse 69 02/11/20 0920  Resp 10 02/11/20 0920  SpO2 99 % 02/11/20 0920  Vitals shown include unvalidated device data.  Last Pain:  Vitals:   02/11/20 0626  TempSrc:   PainSc: 5       Patients Stated Pain Goal: 3 (02/11/20 2182)  Complications: No complications documented.

## 2020-02-11 NOTE — Discharge Instructions (Signed)
Wound Care Keep incision covered and dry for two days.   Do not put any creams, lotions, or ointments on incision. Leave steri-strips on back.  They will fall off by themselves.  Activity Walk each and every day, increasing distance each day. No lifting greater than 5 lbs.  Avoid excessive neck motion. No driving for 2 weeks; may ride as a passenger locally.  Diet Resume your normal diet.   Return to Work Will be discussed at you follow up appointment.  Call Your Doctor If Any of These Occur Redness, drainage, or swelling at the wound.  Temperature greater than 101 degrees. Severe pain not relieved by pain medication. Incision starts to come apart. Follow Up Appt Call today for appointment in 1-2 weeks ([phone]) or for problems.  If you have any hardware placed in your spine, you will need an x-ray before your appointment.  

## 2020-02-11 NOTE — Evaluation (Signed)
Occupational Therapy Evaluation Patient Details Name: Christopher Daniel MRN: [id number] DOB: [birth year] Today's Date: 02/11/2020    History of Present Illness 68 y/o male s/p R L5-S1 lami with micro. PMH includes DM, arthritis.   Clinical Impression   PTA pt living with spouse, independent for BADL/IADL. At time of eval, pt presents with ability to complete transfers and functional mobility beyond household distance independently without AD. Reviewed lower body dressing compensatory strategies. Pt needing consistent cues to enforce precautions- wanting to play golf and pick beans in the garden. Wife present and redirecting patient as well. Back handout provided and reviewed adls in detail. Pt educated on: set an alarm at night for medication, avoid sitting for long periods of time, correct bed positioning for sleeping, correct sequence for bed mobility, avoiding lifting more than 5 pounds and never wash directly over incision. All education is complete and patient and wife indicates understanding. No further OT needs identified, OT will sign off. Thank you for this consult.     Follow Up Recommendations  No OT follow up    Equipment Recommendations  None recommended by OT    Recommendations for Other Services       Precautions / Restrictions Precautions Precautions: Back Precaution Booklet Issued: Yes (comment) Precaution Comments: reviewed throughout with pt and wife Restrictions Weight Bearing Restrictions: No Other Position/Activity Restrictions: no brace      Mobility Bed Mobility Overal bed mobility: Independent                Transfers Overall transfer level: Independent                    Balance Overall balance assessment: Mild deficits observed, not formally tested                                         ADL either performed or assessed with clinical judgement   ADL Overall ADL's : Modified independent                                        General ADL Comments: Pt presents with ability to complete BADL at mod I level. Pt demonstrated LB dressing with figure four method, transfers without assist, and mobility without DME or external assist. Consistent reinforcement of precautions needed     Vision Patient Visual Report: No change from baseline       Perception     Praxis      Pertinent Vitals/Pain Pain Assessment: Faces Faces Pain Scale: Hurts a little bit Pain Location: discomfort at incision site Pain Descriptors / Indicators: Discomfort Pain Intervention(s): Monitored during session     Hand Dominance     Extremity/Trunk Assessment Upper Extremity Assessment Upper Extremity Assessment: Overall WFL for tasks assessed   Lower Extremity Assessment Lower Extremity Assessment: Overall WFL for tasks assessed       Communication Communication Communication: No difficulties   Cognition Arousal/Alertness: Awake/alert Behavior During Therapy: WFL for tasks assessed/performed Overall Cognitive Status: Within Functional Limits for tasks assessed                                 General Comments: needs consistent reinforcement of precautions, wife reports this to be baseline   General  Comments       Exercises     Shoulder Instructions      Home Living Family/patient expects to be discharged to:: Private residence Living Arrangements: Spouse/significant other Available Help at Discharge: Family;Available 24 hours/day Type of Home: House Home Access: Stairs to enter [employer] of Steps: 1-2   Home Layout: One level     Bathroom Shower/Tub: Tub/shower unit;Walk-in shower   Bathroom Toilet: Standard     Home Equipment: Environmental consultant - 2 wheels          Prior Functioning/Environment Level of Independence: Independent                 OT Problem List: Decreased knowledge of use of DME or AE;Decreased knowledge of precautions;Pain      OT  Treatment/Interventions:      OT Goals(Current goals can be found in the care plan section) Acute Rehab OT Goals Patient Stated Goal: retrun to playing golf OT Goal Formulation: All assessment and education complete, DC therapy  OT Frequency:     Barriers to D/C:            Co-evaluation              AM-PAC OT "6 Clicks" Daily Activity     Outcome Measure Help from another person eating meals?: None Help from another person taking care of personal grooming?: None Help from another person toileting, which includes using toliet, bedpan, or urinal?: None Help from another person bathing (including washing, rinsing, drying)?: None Help from another person to put on and taking off regular upper body clothing?: None Help from another person to put on and taking off regular lower body clothing?: None 6 Click Score: 24   End of Session Nurse Communication: Mobility status  Activity Tolerance: Patient tolerated treatment well Patient left: in bed;with family/visitor present  OT Visit Diagnosis: Other abnormalities of gait and mobility (R26.89);Unsteadiness on feet (R26.81);Pain Pain - part of body:  (back)                Time: 3154-0086 OT Time Calculation (min): 27 min Charges:  OT General Charges $OT Visit: 1 Visit OT Evaluation $OT Eval Low Complexity: 1 Low OT Treatments $Self Care/Home Management : 8-22 mins  Zenovia Jarred, MSOT, OTR/L Acute Rehabilitation Services [facility] Office Number: [phone] Pager: [phone]  Zenovia Jarred 02/11/2020, 5:30 PM

## 2020-02-11 NOTE — Anesthesia Procedure Notes (Signed)
Procedure Name: Intubation Date/Time: 02/11/2020 8:12 AM Performed by: Lavell Luster, CRNA Pre-anesthesia Checklist: Patient identified, Emergency Drugs available, Suction available, Patient being monitored and Timeout performed Patient Re-evaluated:Patient Re-evaluated prior to induction Oxygen Delivery Method: Circle system utilized Preoxygenation: Pre-oxygenation with 100% oxygen Induction Type: IV induction Ventilation: Mask ventilation without difficulty Laryngoscope Size: Mac and 4 Grade View: Grade I Tube type: Oral Tube size: 7.5 mm Number of attempts: 1 Airway Equipment and Method: Stylet Placement Confirmation: ETT inserted through vocal cords under direct vision,  positive ETCO2 and breath sounds checked- equal and bilateral Secured at: 22 cm Tube secured with: Tape Dental Injury: Teeth and Oropharynx as per pre-operative assessment and Injury to lip  Comments: Intubation by Paramedic student Rexford Maus, injury to left side upper lip while masking patient.  Dr Marcie Bal aware.  Henderson Cloud, CRNA

## 2020-02-11 NOTE — Discharge Summary (Signed)
Physician Discharge Summary  Patient ID: Christopher Daniel MRN: [id number] DOB/AGE: [birth year] 69 y.o.  Admit date: 02/11/2020 Discharge date: 02/11/2020  Admission Diagnoses:  Discharge Diagnoses:  Active Problems:   Synovial cyst of lumbar facet joint   Discharged Condition: good  Hospital Course: Patient admitted to the hospital where he underwent uncomplicated lumbar laminotomy and resection of synovial cyst.  Postop he is doing very well.  Preoperative back and lower extremity pain much improved.  Standing walking and voiding without difficulty.  Ready for discharge home.  Consults:   Significant Diagnostic Studies:   Treatments:   Discharge Exam: Blood pressure 128/69, pulse (!) 58, temperature 98.3 F (36.8 C), temperature source Oral, resp. rate 19, height 6\' 5"  (1.956 m), weight 93.6 kg, SpO2 98 %. Awake and alert.  Oriented and appropriate.  Motor and sensory function intact.  Wound clean and dry.  Chest and abdomen benign.  Disposition: Discharge disposition: 01-Home or Self Care        Allergies as of 02/11/2020   No Known Allergies     Medication List    TAKE these medications   HYDROcodone-acetaminophen 5-325 MG tablet Commonly known as: NORCO/VICODIN Take 1 tablet by mouth every 4 (four) hours as needed for moderate pain ((score 4 to 6)).   ibuprofen 200 MG tablet Commonly known as: ADVIL Take 400-600 mg by mouth every 8 (eight) hours as needed (for pain.).   TURMERIC PO Take 1 capsule by mouth daily.        Signed: 02/13/2020 Kaya Pottenger 02/11/2020, 5:42 PM

## 2020-02-12 ENCOUNTER — Encounter (HOSPITAL_COMMUNITY): Payer: Self-pay | Admitting: Neurosurgery

## 2020-04-17 ENCOUNTER — Encounter: Payer: Self-pay | Admitting: Internal Medicine

## 2020-04-27 ENCOUNTER — Ambulatory Visit: Payer: Medicare HMO | Admitting: Nurse Practitioner

## 2020-12-20 IMAGING — CR DG LUMBAR SPINE 1V
1 series · 1 of 1 positions shown · non-contrast
Comparison: No prior.

CLINICAL DATA: Lumbar spine surgery.

EXAM:
LUMBAR SPINE - 1 VIEW

[lateral]
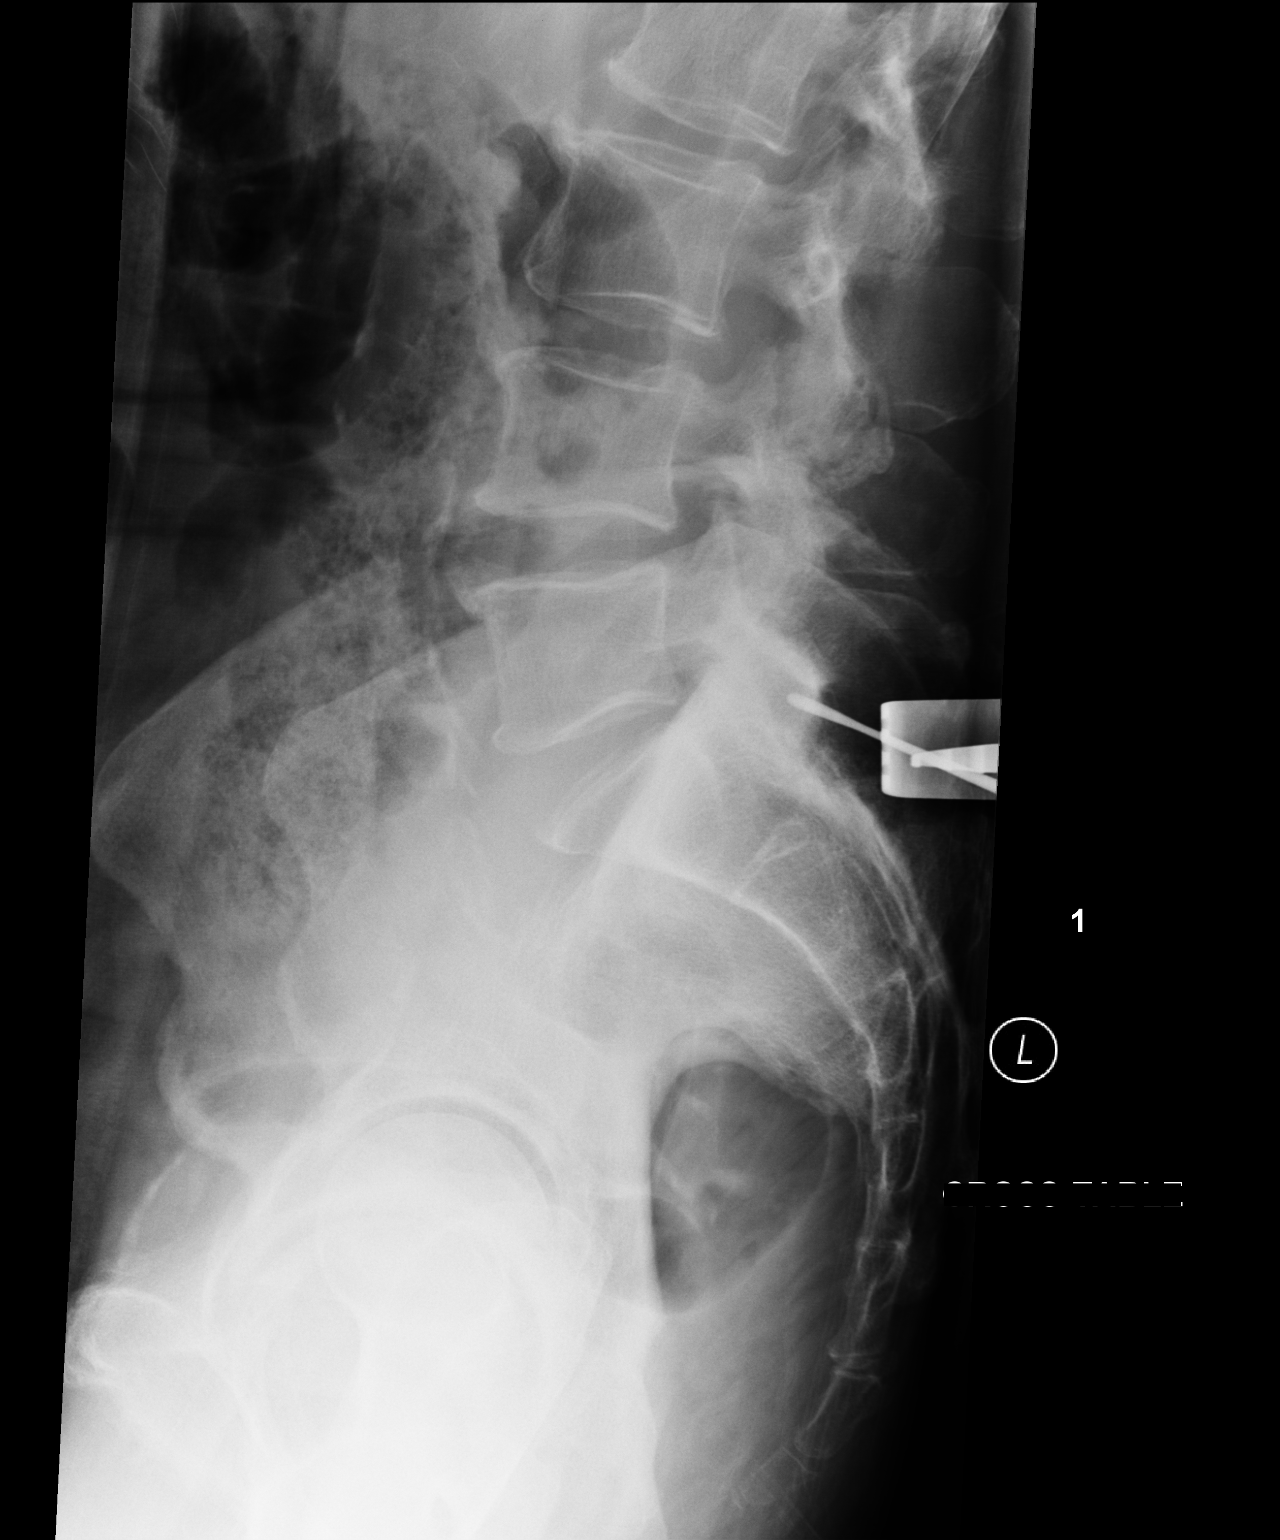

[1 of 1 positions shown; findings below may reference images not displayed]

FINDINGS: Lumbar spine numbered the lowest segmented appearing lumbar shaped
vertebrae on lateral view as L5. Metallic marker noted posteriorly
at the L5-S1 level.
IMPRESSION: Metallic marker noted posteriorly at the L5-S1 level.

## 2023-06-26 ENCOUNTER — Other Ambulatory Visit: Payer: Self-pay | Admitting: Orthopedic Surgery

## 2023-06-26 DIAGNOSIS — I714 Abdominal aortic aneurysm, without rupture, unspecified: Secondary | ICD-10-CM

## 2023-07-03 ENCOUNTER — Inpatient Hospital Stay
Admission: RE | Admit: 2023-07-03 | Discharge: 2023-07-03 | Discharge status: Home / Self Care | Payer: Medicare HMO | Source: Ambulatory Visit | Attending: Orthopedic Surgery | Admitting: Orthopedic Surgery

## 2023-07-03 DIAGNOSIS — I714 Abdominal aortic aneurysm, without rupture, unspecified: Secondary | ICD-10-CM

## 2023-10-31 ENCOUNTER — Other Ambulatory Visit: Payer: Self-pay

## 2023-10-31 ENCOUNTER — Ambulatory Visit: Payer: Medicare HMO | Attending: Orthopedic Surgery

## 2023-10-31 DIAGNOSIS — R600 Localized edema: Secondary | ICD-10-CM | POA: Diagnosis present

## 2023-10-31 DIAGNOSIS — M25662 Stiffness of left knee, not elsewhere classified: Secondary | ICD-10-CM | POA: Diagnosis present

## 2023-10-31 DIAGNOSIS — M25562 Pain in left knee: Secondary | ICD-10-CM | POA: Insufficient documentation

## 2023-10-31 NOTE — Therapy (Signed)
 OUTPATIENT PHYSICAL THERAPY LOWER EXTREMITY EVALUATION   Patient Name: Christopher Daniel MRN: [id number] DOB:[birth year], 73 y.o., male Today's Date: 10/31/2023  END OF SESSION:  PT End of Session - 10/31/23 0848     Visit Number 1    Number of Visits 12    Date for PT Re-Evaluation 01/17/24    PT Start Time 0849    PT Stop Time 0929    PT Time Calculation (min) 40 min    Activity Tolerance Patient tolerated treatment well    Behavior During Therapy [facility] for tasks assessed/performed             Past Medical History:  Diagnosis Date   Arthritis    Diabetes mellitus without complication (HCC)    does not take medication or check sugars   GERD (gastroesophageal reflux disease)    Neuromuscular disorder (HCC)    poly neuropathy   Past Surgical History:  Procedure Laterality Date   EYE SURGERY Bilateral    eyelid   HERNIA REPAIR     LUMBAR LAMINECTOMY/DECOMPRESSION MICRODISCECTOMY Right 02/11/2020   Procedure: Laminectomy for facet/synovial cyst - right - Lumbar Five-Sacral One;  Surgeon: Julio Sicks, MD;  Location: [facility] OR;  Service: Neurosurgery;  Laterality: Right;  Laminectomy for facet/synovial cyst - right - Lumbar Five-Sacral One   Patient Active Problem List   Diagnosis Date Noted   Synovial cyst of lumbar facet joint 02/11/2020   REFERRING PROVIDER: Dannielle Huh, MD   REFERRING DIAG: S/P Left Partial Knee Replacement-DOS 10/23/23   THERAPY DIAG:  Acute pain of left knee  Stiffness of left knee, not elsewhere classified  Localized edema  Rationale for Evaluation and Treatment: Rehabilitation  ONSET DATE: 10/23/23  SUBJECTIVE:   SUBJECTIVE STATEMENT: Patient reports that he had a left partial knee replacement on 10/23/23. He has been hurting quite a bit since surgery. He is taking his pain medication, but it still hurts. He has been using his CPM machine 3-4 times per day for about 2 hours. He has to use a walker when he first gets up in the morning, but it gets  better as he gets moving. He will start walking without his walker once the burning subsides.   PERTINENT HISTORY: Arthritis, current smoker, diabetes, and neuropathy PAIN:  Are you having pain? Yes: NPRS scale: Currently: 0/10 Best: 0/10 Worst: 10/10 Pain location: left knee Pain description: burning Aggravating factors: getting up in the morning, any activity Relieving factors: medication  PRECAUTIONS: None  RED FLAGS: None   WEIGHT BEARING RESTRICTIONS: No  FALLS:  Has patient fallen in last 6 months? No  LIVING ENVIRONMENT: Lives with: lives with their spouse Lives in: House/apartment Stairs: Yes: Internal: 15-20 steps; on left going up; has not tried going up or down steps since surgery Has following equipment at home: Walker - 2 wheeled, Grab bars, and Ramped entry  OCCUPATION: retired  PLOF: Independent  PATIENT GOALS: reduced pain, improved mobility, play golf, be able to get into and out of the boat to fish  NEXT MD VISIT: 10/31/23  OBJECTIVE:  Note: Objective measures were completed at Evaluation unless otherwise noted.  PATIENT SURVEYS:  LEFS 16/80  COGNITION: Overall cognitive status: Within functional limits for tasks assessed     SENSATION: Patient reports tingling from neuropathy.   EDEMA:  Circumferential: Left tibiofemoral joint line: 44.7 cm Right tibiofemoral joint line: 39.5 cm  PALPATION: TTP: left medial tibiofemoral joint line  LOWER EXTREMITY ROM:  Active ROM Right eval Left eval  Hip flexion    Hip extension    Hip abduction    Hip adduction    Hip internal rotation    Hip external rotation    Knee flexion 138 96 PROM: 99  Knee extension 0 11  Ankle dorsiflexion    Ankle plantarflexion    Ankle inversion    Ankle eversion     (Blank rows = not tested)  LOWER EXTREMITY MMT: not tested due to surgical condition  GAIT: Assistive device utilized: None Level of assistance: Complete Independence Comments: step through  pattern with left knee flexed in stance phase, decreased stride length, and gait speed                                                                                                                                 TREATMENT DATE:     PATIENT EDUCATION:  Education details: plan of care, prognosis, healing, expectations following surgery, objective findings, and goals for physical therapy Person educated: Patient Education method: Explanation Education comprehension: verbalized understanding  HOME EXERCISE PROGRAM:   ASSESSMENT:  CLINICAL IMPRESSION: Patient is a 73 y.o. male who was seen today for physical therapy evaluation and treatment following a left partial knee replacement on 10/23/23.  He presented with low pain severity and moderate irritability with left knee active and passive range of motion reproducing his familiar symptoms.  He exhibited increased left knee edema compared to the right knee.  However, he exhibited no other signs or symptoms of a postoperative complication.  Recommend that he continue with skilled physical therapy to address his impairments to return to his prior level of function.  OBJECTIVE IMPAIRMENTS: Abnormal gait, decreased activity tolerance, decreased mobility, difficulty walking, decreased ROM, decreased strength, hypomobility, increased edema, and pain.   ACTIVITY LIMITATIONS: lifting, sitting, standing, squatting, sleeping, stairs, transfers, bed mobility, and locomotion level  PARTICIPATION LIMITATIONS: meal prep, cleaning, laundry, driving, shopping, community activity, and yard work  PERSONAL FACTORS: 3+ comorbidities: Arthritis, current smoker, diabetes, and neuropathy  are also affecting patient's functional outcome.   REHAB POTENTIAL: Good  CLINICAL DECISION MAKING: Evolving/moderate complexity  EVALUATION COMPLEXITY: Moderate   GOALS: Goals reviewed with patient? Yes  SHORT TERM GOALS: Target date: 11/21/23 Patient will be independent  with his initial HEP.  Baseline: Goal status: INITIAL  2.  Patient will improve his active left knee extension to within 5 degrees of neutral for improved gait mechanics. Baseline:  Goal status: INITIAL  3.  Patient will improve his LEFS score to at least 26/80. Baseline:  Goal status: INITIAL  LONG TERM GOALS: Target date: 12/12/23  Patient will be independent with his advanced HEP.  Baseline:  Goal status: INITIAL  2.  Patient will improve his active left knee flexion to at least 120 degrees for improved function navigating stairs. Baseline:  Goal status: INITIAL  3.  Patient will be able to ambulate with minimal to no significant gait deviations for improved mobility.  Baseline:  Goal status: INITIAL  4.  Patient will be able to navigate at least 3 steps with a reciprocal pattern for improved household mobility. Baseline:  Goal status: INITIAL  5.  Patient will improve his LEFS score to at least 40/80. Baseline:  Goal status: INITIAL  PLAN:  PT FREQUENCY: 2x/week  PT DURATION: 6 weeks  PLANNED INTERVENTIONS: 10272- PT Re-evaluation, 97110-Therapeutic exercises, 97530- Therapeutic activity, 97112- Neuromuscular re-education, 97535- Self Care, 53664- Manual therapy, [phone]- Gait training, [phone]- Electrical stimulation (unattended), 97016- Vasopneumatic device, Patient/Family education, Balance training, Stair training, Joint mobilization, Cryotherapy, and Moist heat  PLAN FOR NEXT SESSION: Nustep, isometrics, rocker board, manual therapy, and modalities as needed   Granville Lewis, PT 10/31/2023, 6:31 PM

## 2023-11-05 ENCOUNTER — Ambulatory Visit

## 2023-11-05 DIAGNOSIS — M25662 Stiffness of left knee, not elsewhere classified: Secondary | ICD-10-CM

## 2023-11-05 DIAGNOSIS — R600 Localized edema: Secondary | ICD-10-CM

## 2023-11-05 DIAGNOSIS — M25562 Pain in left knee: Secondary | ICD-10-CM

## 2023-11-05 NOTE — Therapy (Signed)
 OUTPATIENT PHYSICAL THERAPY LOWER EXTREMITY TREATMENT   Patient Name: Christopher Daniel MRN: [id number] DOB:04-16-1951, 73 y.o., male Today's Date: 11/05/2023  END OF SESSION:  PT End of Session - 11/05/23 0934     Visit Number 2    Number of Visits 12    Date for PT Re-Evaluation 01/17/24    PT Start Time 0930    PT Stop Time 1030    PT Time Calculation (min) 60 min    Activity Tolerance Patient tolerated treatment well    Behavior During Therapy WFL for tasks assessed/performed             Past Medical History:  Diagnosis Date   Arthritis    Diabetes mellitus without complication (HCC)    does not take medication or check sugars   GERD (gastroesophageal reflux disease)    Neuromuscular disorder (HCC)    poly neuropathy   Past Surgical History:  Procedure Laterality Date   EYE SURGERY Bilateral    eyelid   HERNIA REPAIR     LUMBAR LAMINECTOMY/DECOMPRESSION MICRODISCECTOMY Right 02/11/2020   Procedure: Laminectomy for facet/synovial cyst - right - Lumbar Five-Sacral One;  Surgeon: Julio Sicks, MD;  Location: [facility] OR;  Service: Neurosurgery;  Laterality: Right;  Laminectomy for facet/synovial cyst - right - Lumbar Five-Sacral One   Patient Active Problem List   Diagnosis Date Noted   Synovial cyst of lumbar facet joint 02/11/2020   REFERRING PROVIDER: Dannielle Huh, MD   REFERRING DIAG: S/P Left Partial Knee Replacement-DOS 10/23/23   THERAPY DIAG:  Acute pain of left knee  Stiffness of left knee, not elsewhere classified  Localized edema  Rationale for Evaluation and Treatment: Rehabilitation  ONSET DATE: 10/23/23  SUBJECTIVE:   SUBJECTIVE STATEMENT: Patient reports 2/10 left knee pain today.  Pt reports that his knee pops with left knee extension without pain.   PERTINENT HISTORY: Arthritis, current smoker, diabetes, and neuropathy PAIN:  Are you having pain? Yes: NPRS scale: 2/10 Pain location: left knee Pain description: burning Aggravating factors:  getting up in the morning, any activity Relieving factors: medication  PRECAUTIONS: None  RED FLAGS: None   WEIGHT BEARING RESTRICTIONS: No  FALLS:  Has patient fallen in last 6 months? No  LIVING ENVIRONMENT: Lives with: lives with their spouse Lives in: House/apartment Stairs: Yes: Internal: 15-20 steps; on left going up; has not tried going up or down steps since surgery Has following equipment at home: Walker - 2 wheeled, Grab bars, and Ramped entry  OCCUPATION: retired  PLOF: Independent  PATIENT GOALS: reduced pain, improved mobility, play golf, be able to get into and out of the boat to fish  NEXT MD VISIT: 10/31/23  OBJECTIVE:  Note: Objective measures were completed at Evaluation unless otherwise noted.  PATIENT SURVEYS:  LEFS 16/80  COGNITION: Overall cognitive status: Within functional limits for tasks assessed     SENSATION: Patient reports tingling from neuropathy.   EDEMA:  Circumferential: Left tibiofemoral joint line: 44.7 cm Right tibiofemoral joint line: 39.5 cm  PALPATION: TTP: left medial tibiofemoral joint line  LOWER EXTREMITY ROM:  Active ROM Right eval Left eval  Hip flexion    Hip extension    Hip abduction    Hip adduction    Hip internal rotation    Hip external rotation    Knee flexion 138 96 PROM: 99  Knee extension 0 11  Ankle dorsiflexion    Ankle plantarflexion    Ankle inversion    Ankle eversion     (  Blank rows = not tested)  LOWER EXTREMITY MMT: not tested due to surgical condition  GAIT: Assistive device utilized: None Level of assistance: Complete Independence Comments: step through pattern with left knee flexed in stance phase, decreased stride length, and gait speed                                                                                                                                 TREATMENT DATE:    11/05/23                                  EXERCISE LOG  Exercise Repetitions and Resistance  Comments  Nustep Lvl 3 x 15 mins    Rockerboard 3.5 mins   Heel Slides 20 reps    HS 20 reps x 3 sec hold   QS 20 reps x 3 sec hold   SAQ 20 reps   Supine Hip Abduction Red 20 reps x 3 sec hold   Supine Hip Adduction 20 reps x 3 sec hold    Blank cell = exercise not performed today   Modalities  Date:  Unattended Estim: Knee, IFC 80-150 Hz, 15 mins, Pain Vaso: Knee, 34 degrees; low pressure, 15 mins, Pain and Edema   PATIENT EDUCATION:  Education details: plan of care, prognosis, healing, expectations following surgery, objective findings, and goals for physical therapy Person educated: Patient Education method: Explanation Education comprehension: verbalized understanding  HOME EXERCISE PROGRAM:   ASSESSMENT:  CLINICAL IMPRESSION: Pt arrives for today's treatment session reporting 2/10 left knee pain.  Pt states that his knee pops without pain whenever he extends fully.  Pt able to tolerate warm up on Nustep today with minimal discomfort.  Pt instructed in standing and supine left knee exercises today to increase strength and function.  Pt requiring min cues for proper technique and hold times for all newly added exercises.  Normal responses to estim and vaso noted upon removal.  Pt reports 1/10 left knee pain at completion of today's treatment session.   OBJECTIVE IMPAIRMENTS: Abnormal gait, decreased activity tolerance, decreased mobility, difficulty walking, decreased ROM, decreased strength, hypomobility, increased edema, and pain.   ACTIVITY LIMITATIONS: lifting, sitting, standing, squatting, sleeping, stairs, transfers, bed mobility, and locomotion level  PARTICIPATION LIMITATIONS: meal prep, cleaning, laundry, driving, shopping, community activity, and yard work  PERSONAL FACTORS: 3+ comorbidities: Arthritis, current smoker, diabetes, and neuropathy  are also affecting patient's functional outcome.   REHAB POTENTIAL: Good  CLINICAL DECISION MAKING: Evolving/moderate  complexity  EVALUATION COMPLEXITY: Moderate   GOALS: Goals reviewed with patient? Yes  SHORT TERM GOALS: Target date: 11/21/23 Patient will be independent with his initial HEP.  Baseline: Goal status: INITIAL  2.  Patient will improve his active left knee extension to within 5 degrees of neutral for improved gait mechanics. Baseline:  Goal status: INITIAL  3.  Patient will  improve his LEFS score to at least 26/80. Baseline:  Goal status: INITIAL  LONG TERM GOALS: Target date: 12/12/23  Patient will be independent with his advanced HEP.  Baseline:  Goal status: INITIAL  2.  Patient will improve his active left knee flexion to at least 120 degrees for improved function navigating stairs. Baseline:  Goal status: INITIAL  3.  Patient will be able to ambulate with minimal to no significant gait deviations for improved mobility. Baseline:  Goal status: INITIAL  4.  Patient will be able to navigate at least 3 steps with a reciprocal pattern for improved household mobility. Baseline:  Goal status: INITIAL  5.  Patient will improve his LEFS score to at least 40/80. Baseline:  Goal status: INITIAL  PLAN:  PT FREQUENCY: 2x/week  PT DURATION: 6 weeks  PLANNED INTERVENTIONS: 45409- PT Re-evaluation, 97110-Therapeutic exercises, 97530- Therapeutic activity, 97112- Neuromuscular re-education, 97535- Self Care, 81191- Manual therapy, [phone]- Gait training, [phone]- Electrical stimulation (unattended), 97016- Vasopneumatic device, Patient/Family education, Balance training, Stair training, Joint mobilization, Cryotherapy, and Moist heat  PLAN FOR NEXT SESSION: Nustep, isometrics, rocker board, manual therapy, and modalities as needed   Newman Pies, PTA 11/05/2023, 11:40 AM

## 2023-11-07 ENCOUNTER — Ambulatory Visit

## 2023-11-07 DIAGNOSIS — M25562 Pain in left knee: Secondary | ICD-10-CM | POA: Diagnosis not present

## 2023-11-07 DIAGNOSIS — M25662 Stiffness of left knee, not elsewhere classified: Secondary | ICD-10-CM

## 2023-11-07 DIAGNOSIS — R600 Localized edema: Secondary | ICD-10-CM

## 2023-11-07 NOTE — Therapy (Signed)
 OUTPATIENT PHYSICAL THERAPY LOWER EXTREMITY TREATMENT   Patient Name: Christopher Daniel MRN: [id number] DOB:[birth year], 73 y.o., male Today's Date: 11/07/2023  END OF SESSION:  PT End of Session - 11/07/23 0936     Visit Number 3    Number of Visits 12    Date for PT Re-Evaluation 01/17/24    Authorization - Number of Visits 12    PT Start Time 0930    PT Stop Time 1031    PT Time Calculation (min) 61 min    Activity Tolerance Patient tolerated treatment well    Behavior During Therapy WFL for tasks assessed/performed             Past Medical History:  Diagnosis Date   Arthritis    Diabetes mellitus without complication (HCC)    does not take medication or check sugars   GERD (gastroesophageal reflux disease)    Neuromuscular disorder (HCC)    poly neuropathy   Past Surgical History:  Procedure Laterality Date   EYE SURGERY Bilateral    eyelid   HERNIA REPAIR     LUMBAR LAMINECTOMY/DECOMPRESSION MICRODISCECTOMY Right 02/11/2020   Procedure: Laminectomy for facet/synovial cyst - right - Lumbar Five-Sacral One;  Surgeon: Julio Sicks, MD;  Location: [facility] OR;  Service: Neurosurgery;  Laterality: Right;  Laminectomy for facet/synovial cyst - right - Lumbar Five-Sacral One   Patient Active Problem List   Diagnosis Date Noted   Synovial cyst of lumbar facet joint 02/11/2020   REFERRING PROVIDER: Dannielle Huh, MD   REFERRING DIAG: S/P Left Partial Knee Replacement-DOS 10/23/23   THERAPY DIAG:  Acute pain of left knee  Stiffness of left knee, not elsewhere classified  Localized edema  Rationale for Evaluation and Treatment: Rehabilitation  ONSET DATE: 10/23/23  SUBJECTIVE:   SUBJECTIVE STATEMENT: "It feels like hell."  PERTINENT HISTORY: Arthritis, current smoker, diabetes, and neuropathy PAIN:  Are you having pain? Yes: NPRS scale: 6-7/10 Pain location: left knee Pain description: burning Aggravating factors: getting up in the morning, any activity Relieving  factors: medication  PRECAUTIONS: None  RED FLAGS: None   WEIGHT BEARING RESTRICTIONS: No  FALLS:  Has patient fallen in last 6 months? No  LIVING ENVIRONMENT: Lives with: lives with their spouse Lives in: House/apartment Stairs: Yes: Internal: 15-20 steps; on left going up; has not tried going up or down steps since surgery Has following equipment at home: Walker - 2 wheeled, Grab bars, and Ramped entry  OCCUPATION: retired  PLOF: Independent  PATIENT GOALS: reduced pain, improved mobility, play golf, be able to get into and out of the boat to fish  NEXT MD VISIT: 10/31/23  OBJECTIVE:  Note: Objective measures were completed at Evaluation unless otherwise noted.  PATIENT SURVEYS:  LEFS 16/80  COGNITION: Overall cognitive status: Within functional limits for tasks assessed     SENSATION: Patient reports tingling from neuropathy.   EDEMA:  Circumferential: Left tibiofemoral joint line: 44.7 cm Right tibiofemoral joint line: 39.5 cm  PALPATION: TTP: left medial tibiofemoral joint line  LOWER EXTREMITY ROM:  Active ROM Right eval Left eval  Hip flexion    Hip extension    Hip abduction    Hip adduction    Hip internal rotation    Hip external rotation    Knee flexion 138 96 PROM: 99  Knee extension 0 11  Ankle dorsiflexion    Ankle plantarflexion    Ankle inversion    Ankle eversion     (Blank rows = not tested)  LOWER  EXTREMITY MMT: not tested due to surgical condition  GAIT: Assistive device utilized: None Level of assistance: Complete Independence Comments: step through pattern with left knee flexed in stance phase, decreased stride length, and gait speed                                                                                                                                 TREATMENT DATE:    11/07/23                                  EXERCISE LOG  Exercise Repetitions and Resistance Comments  Nustep Lvl 3 x 15 mins    Rockerboard 4  mins   Lunges 8" box x 3 mins   Forward Step Ups 6" box x 20 reps   Heel Slides    HS    QS    LAQ 2# x 20 reps   Seated marches 2# x 20 reps   Supine Hip Abduction    Supine Hip Adduction     Blank cell = exercise not performed today   Modalities  Date:  Unattended Estim: Knee, IFC 80-150 Hz, 15 mins, Pain Vaso: Knee, 34 degrees; low pressure, 15 mins, Pain and Edema   PATIENT EDUCATION:  Education details: plan of care, prognosis, healing, expectations following surgery, objective findings, and goals for physical therapy Person educated: Patient Education method: Explanation Education comprehension: verbalized understanding  HOME EXERCISE PROGRAM:   ASSESSMENT:  CLINICAL IMPRESSION: Pt arrives for today's treatment session reporting 6-7/10 left knee pain.   Pt reports that pain got up to 7-8/10 around 3 am this morning and has decreased minimally with Tylenol.  Pt introduced to standing lunges and forward step up today.  Pt requiring cues for sequences and to avoid pulling up with BUEs.  Pt also introduced to LAQs and seated marches today with min tactile and verbal cues required to avoid compensatory leaning with each rep.  Normal responses to estim and vaso noted upon removal.  Pt reported 5/10 left knee pain at completion of today's treatment.   OBJECTIVE IMPAIRMENTS: Abnormal gait, decreased activity tolerance, decreased mobility, difficulty walking, decreased ROM, decreased strength, hypomobility, increased edema, and pain.   ACTIVITY LIMITATIONS: lifting, sitting, standing, squatting, sleeping, stairs, transfers, bed mobility, and locomotion level  PARTICIPATION LIMITATIONS: meal prep, cleaning, laundry, driving, shopping, community activity, and yard work  PERSONAL FACTORS: 3+ comorbidities: Arthritis, current smoker, diabetes, and neuropathy  are also affecting patient's functional outcome.   REHAB POTENTIAL: Good  CLINICAL DECISION MAKING: Evolving/moderate  complexity  EVALUATION COMPLEXITY: Moderate   GOALS: Goals reviewed with patient? Yes  SHORT TERM GOALS: Target date: 11/21/23 Patient will be independent with his initial HEP.  Baseline: Goal status: INITIAL  2.  Patient will improve his active left knee extension to within 5 degrees of neutral for improved gait mechanics. Baseline:  Goal status: INITIAL  3.  Patient will improve his LEFS score to at least 26/80. Baseline:  Goal status: INITIAL  LONG TERM GOALS: Target date: 12/12/23  Patient will be independent with his advanced HEP.  Baseline:  Goal status: INITIAL  2.  Patient will improve his active left knee flexion to at least 120 degrees for improved function navigating stairs. Baseline:  Goal status: INITIAL  3.  Patient will be able to ambulate with minimal to no significant gait deviations for improved mobility. Baseline:  Goal status: INITIAL  4.  Patient will be able to navigate at least 3 steps with a reciprocal pattern for improved household mobility. Baseline:  Goal status: INITIAL  5.  Patient will improve his LEFS score to at least 40/80. Baseline:  Goal status: INITIAL  PLAN:  PT FREQUENCY: 2x/week  PT DURATION: 6 weeks  PLANNED INTERVENTIONS: 16109- PT Re-evaluation, 97110-Therapeutic exercises, 97530- Therapeutic activity, 97112- Neuromuscular re-education, 97535- Self Care, 60454- Manual therapy, [phone]- Gait training, [phone]- Electrical stimulation (unattended), 97016- Vasopneumatic device, Patient/Family education, Balance training, Stair training, Joint mobilization, Cryotherapy, and Moist heat  PLAN FOR NEXT SESSION: Nustep, isometrics, rocker board, manual therapy, and modalities as needed   Newman Pies, PTA 11/07/2023, 10:46 AM

## 2023-11-12 ENCOUNTER — Ambulatory Visit

## 2023-11-12 DIAGNOSIS — M25562 Pain in left knee: Secondary | ICD-10-CM

## 2023-11-12 DIAGNOSIS — M25662 Stiffness of left knee, not elsewhere classified: Secondary | ICD-10-CM

## 2023-11-12 DIAGNOSIS — R600 Localized edema: Secondary | ICD-10-CM

## 2023-11-12 NOTE — Therapy (Signed)
 OUTPATIENT PHYSICAL THERAPY LOWER EXTREMITY TREATMENT   Patient Name: Christopher Daniel MRN: [id number] DOB:07/28/1951, 73 y.o., male Today's Date: 11/12/2023  END OF SESSION:  PT End of Session - 11/12/23 1102     Visit Number 4    Number of Visits 12    Date for PT Re-Evaluation 01/17/24    Authorization - Number of Visits 12    PT Start Time 1100    PT Stop Time 1151    PT Time Calculation (min) 51 min    Activity Tolerance Patient tolerated treatment well    Behavior During Therapy WFL for tasks assessed/performed             Past Medical History:  Diagnosis Date   Arthritis    Diabetes mellitus without complication (HCC)    does not take medication or check sugars   GERD (gastroesophageal reflux disease)    Neuromuscular disorder (HCC)    poly neuropathy   Past Surgical History:  Procedure Laterality Date   EYE SURGERY Bilateral    eyelid   HERNIA REPAIR     LUMBAR LAMINECTOMY/DECOMPRESSION MICRODISCECTOMY Right 02/11/2020   Procedure: Laminectomy for facet/synovial cyst - right - Lumbar Five-Sacral One;  Surgeon: Julio Sicks, MD;  Location: [facility] OR;  Service: Neurosurgery;  Laterality: Right;  Laminectomy for facet/synovial cyst - right - Lumbar Five-Sacral One   Patient Active Problem List   Diagnosis Date Noted   Synovial cyst of lumbar facet joint 02/11/2020   REFERRING PROVIDER: Dannielle Huh, MD   REFERRING DIAG: S/P Left Partial Knee Replacement-DOS 10/23/23   THERAPY DIAG:  Acute pain of left knee  Stiffness of left knee, not elsewhere classified  Localized edema  Rationale for Evaluation and Treatment: Rehabilitation  ONSET DATE: 10/23/23  SUBJECTIVE:   SUBJECTIVE STATEMENT: Pt reports 2-3/10 left knee pain.   PERTINENT HISTORY: Arthritis, current smoker, diabetes, and neuropathy PAIN:  Are you having pain? Yes: NPRS scale: 2-3/10 Pain location: left knee Pain description: burning Aggravating factors: getting up in the morning, any  activity Relieving factors: medication  PRECAUTIONS: None  RED FLAGS: None   WEIGHT BEARING RESTRICTIONS: No  FALLS:  Has patient fallen in last 6 months? No  LIVING ENVIRONMENT: Lives with: lives with their spouse Lives in: House/apartment Stairs: Yes: Internal: 15-20 steps; on left going up; has not tried going up or down steps since surgery Has following equipment at home: Walker - 2 wheeled, Grab bars, and Ramped entry  OCCUPATION: retired  PLOF: Independent  PATIENT GOALS: reduced pain, improved mobility, play golf, be able to get into and out of the boat to fish  NEXT MD VISIT: 10/31/23  OBJECTIVE:  Note: Objective measures were completed at Evaluation unless otherwise noted.  PATIENT SURVEYS:  LEFS 16/80  COGNITION: Overall cognitive status: Within functional limits for tasks assessed     SENSATION: Patient reports tingling from neuropathy.   EDEMA:  Circumferential: Left tibiofemoral joint line: 44.7 cm Right tibiofemoral joint line: 39.5 cm  PALPATION: TTP: left medial tibiofemoral joint line  LOWER EXTREMITY ROM:  Active ROM Right eval Left eval  Hip flexion    Hip extension    Hip abduction    Hip adduction    Hip internal rotation    Hip external rotation    Knee flexion 138 96 PROM: 99  Knee extension 0 11  Ankle dorsiflexion    Ankle plantarflexion    Ankle inversion    Ankle eversion     (Blank rows = not  tested)  LOWER EXTREMITY MMT: not tested due to surgical condition  GAIT: Assistive device utilized: None Level of assistance: Complete Independence Comments: step through pattern with left knee flexed in stance phase, decreased stride length, and gait speed                                                                                                                                 TREATMENT DATE:    11/12/23                                  EXERCISE LOG  Exercise Repetitions and Resistance Comments  Nustep Lvl 3 x 15 mins     Rockerboard 5 mins   Lunges 14" box x 3 mins   Forward Step Ups 6" box x 25 reps   Heel Slides    HS    QS    LAQ 2# x 25 reps   Seated marches 2# x 25 reps   Seated ham curls Red x 20 reps        Blank cell = exercise not performed today   Modalities  Date:  Unattended Estim: Knee, IFC 80-150 Hz, 15 mins, Pain Vaso: Knee, 34 degrees; low pressure, 15 mins, Pain and Edema   PATIENT EDUCATION:  Education details: plan of care, prognosis, healing, expectations following surgery, objective findings, and goals for physical therapy Person educated: Patient Education method: Explanation Education comprehension: verbalized understanding  HOME EXERCISE PROGRAM:   ASSESSMENT:  CLINICAL IMPRESSION: Pt arrives for today's treatment session reporting 2-3/10 left knee pain.   Pt able to tolerate advancement of Nustep seat two places while warming up today.  Pt able to tolerate increased height of step for forward lunges with minimal discomfort.  Pt able to tolerate increased reps with all previously performed seated exercises.  Pt introduced to seated ham curls with good results.  Normal responses to estim and vaso noted upon removal.  Pt reported 3/10 left knee pain at completion of today's treatment session.   OBJECTIVE IMPAIRMENTS: Abnormal gait, decreased activity tolerance, decreased mobility, difficulty walking, decreased ROM, decreased strength, hypomobility, increased edema, and pain.   ACTIVITY LIMITATIONS: lifting, sitting, standing, squatting, sleeping, stairs, transfers, bed mobility, and locomotion level  PARTICIPATION LIMITATIONS: meal prep, cleaning, laundry, driving, shopping, community activity, and yard work  PERSONAL FACTORS: 3+ comorbidities: Arthritis, current smoker, diabetes, and neuropathy  are also affecting patient's functional outcome.   REHAB POTENTIAL: Good  CLINICAL DECISION MAKING: Evolving/moderate complexity  EVALUATION COMPLEXITY:  Moderate   GOALS: Goals reviewed with patient? Yes  SHORT TERM GOALS: Target date: 11/21/23 Patient will be independent with his initial HEP.  Baseline: Goal status: INITIAL  2.  Patient will improve his active left knee extension to within 5 degrees of neutral for improved gait mechanics. Baseline:  Goal status: INITIAL  3.  Patient will improve his  LEFS score to at least 26/80. Baseline:  Goal status: INITIAL  LONG TERM GOALS: Target date: 12/12/23  Patient will be independent with his advanced HEP.  Baseline:  Goal status: INITIAL  2.  Patient will improve his active left knee flexion to at least 120 degrees for improved function navigating stairs. Baseline:  Goal status: INITIAL  3.  Patient will be able to ambulate with minimal to no significant gait deviations for improved mobility. Baseline:  Goal status: INITIAL  4.  Patient will be able to navigate at least 3 steps with a reciprocal pattern for improved household mobility. Baseline:  Goal status: INITIAL  5.  Patient will improve his LEFS score to at least 40/80. Baseline:  Goal status: INITIAL  PLAN:  PT FREQUENCY: 2x/week  PT DURATION: 6 weeks  PLANNED INTERVENTIONS: 47829- PT Re-evaluation, 97110-Therapeutic exercises, 97530- Therapeutic activity, 97112- Neuromuscular re-education, 97535- Self Care, 56213- Manual therapy, [phone]- Gait training, [phone]- Electrical stimulation (unattended), 97016- Vasopneumatic device, Patient/Family education, Balance training, Stair training, Joint mobilization, Cryotherapy, and Moist heat  PLAN FOR NEXT SESSION: Nustep, isometrics, rocker board, manual therapy, and modalities as needed   Newman Pies, PTA 11/12/2023, 11:51 AM

## 2023-11-14 ENCOUNTER — Ambulatory Visit

## 2023-11-14 DIAGNOSIS — M25662 Stiffness of left knee, not elsewhere classified: Secondary | ICD-10-CM

## 2023-11-14 DIAGNOSIS — M25562 Pain in left knee: Secondary | ICD-10-CM

## 2023-11-14 DIAGNOSIS — R600 Localized edema: Secondary | ICD-10-CM

## 2023-11-14 NOTE — Therapy (Signed)
 OUTPATIENT PHYSICAL THERAPY LOWER EXTREMITY TREATMENT   Patient Name: Christopher Daniel MRN: [id number] DOB:May 01, 1951, 73 y.o., male Today's Date: 11/14/2023  END OF SESSION:  PT End of Session - 11/14/23 0932     Visit Number 5    Number of Visits 12    Date for PT Re-Evaluation 01/17/24    Authorization - Number of Visits 12    PT Start Time 0930    PT Stop Time 1026    PT Time Calculation (min) 56 min    Activity Tolerance Patient tolerated treatment well    Behavior During Therapy WFL for tasks assessed/performed             Past Medical History:  Diagnosis Date   Arthritis    Diabetes mellitus without complication (HCC)    does not take medication or check sugars   GERD (gastroesophageal reflux disease)    Neuromuscular disorder (HCC)    poly neuropathy   Past Surgical History:  Procedure Laterality Date   EYE SURGERY Bilateral    eyelid   HERNIA REPAIR     LUMBAR LAMINECTOMY/DECOMPRESSION MICRODISCECTOMY Right 02/11/2020   Procedure: Laminectomy for facet/synovial cyst - right - Lumbar Five-Sacral One;  Surgeon: Julio Sicks, MD;  Location: [facility] OR;  Service: Neurosurgery;  Laterality: Right;  Laminectomy for facet/synovial cyst - right - Lumbar Five-Sacral One   Patient Active Problem List   Diagnosis Date Noted   Synovial cyst of lumbar facet joint 02/11/2020   REFERRING PROVIDER: Dannielle Huh, MD   REFERRING DIAG: S/P Left Partial Knee Replacement-DOS 10/23/23   THERAPY DIAG:  Acute pain of left knee  Stiffness of left knee, not elsewhere classified  Localized edema  Rationale for Evaluation and Treatment: Rehabilitation  ONSET DATE: 10/23/23  SUBJECTIVE:   SUBJECTIVE STATEMENT: Pt reports 1-2/10 left knee pain.  Increased stiffness yesterday.  PERTINENT HISTORY: Arthritis, current smoker, diabetes, and neuropathy PAIN:  Are you having pain? Yes: NPRS scale: 1-2/10 Pain location: left knee Pain description: burning Aggravating factors: getting up  in the morning, any activity Relieving factors: medication  PRECAUTIONS: None  RED FLAGS: None   WEIGHT BEARING RESTRICTIONS: No  FALLS:  Has patient fallen in last 6 months? No  LIVING ENVIRONMENT: Lives with: lives with their spouse Lives in: House/apartment Stairs: Yes: Internal: 15-20 steps; on left going up; has not tried going up or down steps since surgery Has following equipment at home: Walker - 2 wheeled, Grab bars, and Ramped entry  OCCUPATION: retired  PLOF: Independent  PATIENT GOALS: reduced pain, improved mobility, play golf, be able to get into and out of the boat to fish  NEXT MD VISIT: 10/31/23  OBJECTIVE:  Note: Objective measures were completed at Evaluation unless otherwise noted.  PATIENT SURVEYS:  LEFS 16/80  COGNITION: Overall cognitive status: Within functional limits for tasks assessed     SENSATION: Patient reports tingling from neuropathy.   EDEMA:  Circumferential: Left tibiofemoral joint line: 44.7 cm Right tibiofemoral joint line: 39.5 cm  PALPATION: TTP: left medial tibiofemoral joint line  LOWER EXTREMITY ROM:  Active ROM Right eval Left eval  Hip flexion    Hip extension    Hip abduction    Hip adduction    Hip internal rotation    Hip external rotation    Knee flexion 138 96 PROM: 99  Knee extension 0 11  Ankle dorsiflexion    Ankle plantarflexion    Ankle inversion    Ankle eversion     (Blank  rows = not tested)  LOWER EXTREMITY MMT: not tested due to surgical condition  GAIT: Assistive device utilized: None Level of assistance: Complete Independence Comments: step through pattern with left knee flexed in stance phase, decreased stride length, and gait speed                                                                                                                                 TREATMENT DATE:    11/14/23                                  EXERCISE LOG  Exercise Repetitions and Resistance Comments   Nustep Lvl 4 x 15 mins; seat 11-9   Rockerboard 5 mins   Lunges 14" box x 4 mins   Forward Step Ups 6" box x 30 reps   Heel Slides    HS    QS    LAQ 3# x 25 reps   Seated marches 3 # x 25 reps   Seated ham curls Red x 25 reps        Blank cell = exercise not performed today   Modalities  Date:  Unattended Estim: Knee, IFC 80-150 Hz, 15 mins, Pain Vaso: Knee, 34 degrees; low pressure, 15 mins, Pain and Edema   PATIENT EDUCATION:  Education details: plan of care, prognosis, healing, expectations following surgery, objective findings, and goals for physical therapy Person educated: Patient Education method: Explanation Education comprehension: verbalized understanding  HOME EXERCISE PROGRAM:   ASSESSMENT:  CLINICAL IMPRESSION: Pt arrives for today's treatment session reporting 1-2/10 left knee pain.   Pt states that he had increased stiffness yesterday, but is feeling better today.  Pt able to progress to seat 9 on the Nustep today with minimal discomfort.  Pt able to tolerate increased resistance or reps with all seated exercises today.  Pt able to demonstrate full left knee extension and 126 degrees of left knee flexion meeting both his long and short term goals.  Pt has met all of his STGs at this time.  Normal responses to estim and vaso noted upon removal.  Pt denied any pain at completion of today's treatment session.  OBJECTIVE IMPAIRMENTS: Abnormal gait, decreased activity tolerance, decreased mobility, difficulty walking, decreased ROM, decreased strength, hypomobility, increased edema, and pain.   ACTIVITY LIMITATIONS: lifting, sitting, standing, squatting, sleeping, stairs, transfers, bed mobility, and locomotion level  PARTICIPATION LIMITATIONS: meal prep, cleaning, laundry, driving, shopping, community activity, and yard work  PERSONAL FACTORS: 3+ comorbidities: Arthritis, current smoker, diabetes, and neuropathy  are also affecting patient's functional outcome.    REHAB POTENTIAL: Good  CLINICAL DECISION MAKING: Evolving/moderate complexity  EVALUATION COMPLEXITY: Moderate   GOALS: Goals reviewed with patient? Yes  SHORT TERM GOALS: Target date: 11/21/23 Patient will be independent with his initial HEP.  Baseline: Goal status: MET  2.  Patient will improve  his active left knee extension to within 5 degrees of neutral for improved gait mechanics. Baseline: 3/27: full extension Goal status: MET  3.  Patient will improve his LEFS score to at least 26/80. Baseline: 32/80 Goal status: MET  LONG TERM GOALS: Target date: 12/12/23  Patient will be independent with his advanced HEP.  Baseline:  Goal status: IN PROGRESS  2.  Patient will improve his active left knee flexion to at least 120 degrees for improved function navigating stairs. Baseline: 3/27: 126 degrees Goal status: MET  3.  Patient will be able to ambulate with minimal to no significant gait deviations for improved mobility. Baseline:  Goal status: IN PROGRESS  4.  Patient will be able to navigate at least 3 steps with a reciprocal pattern for improved household mobility. Baseline:  Goal status: IN PROGRESS  5.  Patient will improve his LEFS score to at least 40/80. Baseline:  Goal status: IN PROGRESS  PLAN:  PT FREQUENCY: 2x/week  PT DURATION: 6 weeks  PLANNED INTERVENTIONS: 97164- PT Re-evaluation, 97110-Therapeutic exercises, 97530- Therapeutic activity, 97112- Neuromuscular re-education, 97535- Self Care, 41324- Manual therapy, [phone]- Gait training, [phone]- Electrical stimulation (unattended), 97016- Vasopneumatic device, Patient/Family education, Balance training, Stair training, Joint mobilization, Cryotherapy, and Moist heat  PLAN FOR NEXT SESSION: Nustep, isometrics, rocker board, manual therapy, and modalities as needed   Newman Pies, PTA 11/14/2023, 10:28 AM

## 2023-11-19 ENCOUNTER — Encounter: Payer: Self-pay | Admitting: Physical Therapy

## 2023-11-19 ENCOUNTER — Ambulatory Visit: Attending: Orthopedic Surgery | Admitting: Physical Therapy

## 2023-11-19 DIAGNOSIS — M25562 Pain in left knee: Secondary | ICD-10-CM | POA: Insufficient documentation

## 2023-11-19 DIAGNOSIS — M25662 Stiffness of left knee, not elsewhere classified: Secondary | ICD-10-CM | POA: Insufficient documentation

## 2023-11-19 DIAGNOSIS — R600 Localized edema: Secondary | ICD-10-CM | POA: Diagnosis present

## 2023-11-19 NOTE — Therapy (Signed)
 OUTPATIENT PHYSICAL THERAPY LOWER EXTREMITY TREATMENT   Patient Name: Christopher Daniel MRN: [id number] DOB:10-Oct-1950, 73 y.o., male Today's Date: 11/19/2023  END OF SESSION:  PT End of Session - 11/19/23 0940     Visit Number 6    Number of Visits 12    Date for PT Re-Evaluation 01/17/24    PT Start Time 0930    PT Stop Time 1024    PT Time Calculation (min) 54 min    Activity Tolerance Patient tolerated treatment well    Behavior During Therapy WFL for tasks assessed/performed             Past Medical History:  Diagnosis Date   Arthritis    Diabetes mellitus without complication (HCC)    does not take medication or check sugars   GERD (gastroesophageal reflux disease)    Neuromuscular disorder (HCC)    poly neuropathy   Past Surgical History:  Procedure Laterality Date   EYE SURGERY Bilateral    eyelid   HERNIA REPAIR     LUMBAR LAMINECTOMY/DECOMPRESSION MICRODISCECTOMY Right 02/11/2020   Procedure: Laminectomy for facet/synovial cyst - right - Lumbar Five-Sacral One;  Surgeon: Julio Sicks, MD;  Location: [facility] OR;  Service: Neurosurgery;  Laterality: Right;  Laminectomy for facet/synovial cyst - right - Lumbar Five-Sacral One   Patient Active Problem List   Diagnosis Date Noted   Synovial cyst of lumbar facet joint 02/11/2020   REFERRING PROVIDER: Dannielle Huh, MD   REFERRING DIAG: S/P Left Partial Knee Replacement-DOS 10/23/23   THERAPY DIAG:  Acute pain of left knee  Stiffness of left knee, not elsewhere classified  Localized edema  Rationale for Evaluation and Treatment: Rehabilitation  ONSET DATE: 10/23/23  SUBJECTIVE:   SUBJECTIVE STATEMENT: Doing good but still having trouble sleeping due to pain.  PERTINENT HISTORY: Arthritis, current smoker, diabetes, and neuropathy PAIN:  Are you having pain? Yes: NPRS scale: 1-2/10 Pain location: left knee Pain description: burning Aggravating factors: getting up in the morning, any activity Relieving factors:  medication  PRECAUTIONS: None  RED FLAGS: None   WEIGHT BEARING RESTRICTIONS: No  FALLS:  Has patient fallen in last 6 months? No  LIVING ENVIRONMENT: Lives with: lives with their spouse Lives in: House/apartment Stairs: Yes: Internal: 15-20 steps; on left going up; has not tried going up or down steps since surgery Has following equipment at home: Walker - 2 wheeled, Grab bars, and Ramped entry  OCCUPATION: retired  PLOF: Independent  PATIENT GOALS: reduced pain, improved mobility, play golf, be able to get into and out of the boat to fish  NEXT MD VISIT: 10/31/23  OBJECTIVE:  Note: Objective measures were completed at Evaluation unless otherwise noted.  PATIENT SURVEYS:  LEFS 16/80  COGNITION: Overall cognitive status: Within functional limits for tasks assessed     SENSATION: Patient reports tingling from neuropathy.   EDEMA:  Circumferential: Left tibiofemoral joint line: 44.7 cm Right tibiofemoral joint line: 39.5 cm  PALPATION: TTP: left medial tibiofemoral joint line  LOWER EXTREMITY ROM:  Active ROM Right eval Left eval  Hip flexion    Hip extension    Hip abduction    Hip adduction    Hip internal rotation    Hip external rotation    Knee flexion 138 96 PROM: 99  Knee extension 0 11  Ankle dorsiflexion    Ankle plantarflexion    Ankle inversion    Ankle eversion     (Blank rows = not tested)  LOWER EXTREMITY MMT: not  tested due to surgical condition  GAIT: Assistive device utilized: None Level of assistance: Complete Independence Comments: step through pattern with left knee flexed in stance phase, decreased stride length, and gait speed                                                                                                                                 TREATMENT DATE:    11/19/23:                                     EXERCISE LOG  Exercise Repetitions and Resistance Comments  Nustep  Level 4 x 15 minutes   Recumbent  bike  Level 3 x progressing to seat 9 x 10 minutes   Knee ext 10# x 3 minutes   Ham curls 40# x 3 minutes       IFC at 80-150 Hz on 40% scan x 15 minutes to patient's left medial knee.  Normal modality response following removal of modality.   11/14/23                                  EXERCISE LOG  Exercise Repetitions and Resistance Comments  Nustep Lvl 4 x 15 mins; seat 11-9   Rockerboard 5 mins   Lunges 14" box x 4 mins   Forward Step Ups 6" box x 30 reps   Heel Slides    HS    QS    LAQ 3# x 25 reps   Seated marches 3 # x 25 reps   Seated ham curls Red x 25 reps        Blank cell = exercise not performed today   Modalities  Date:  Unattended Estim: Knee, IFC 80-150 Hz, 15 mins, Pain Vaso: Knee, 34 degrees; low pressure, 15 mins, Pain and Edema   PATIENT EDUCATION:  Education details: plan of care, prognosis, healing, expectations following surgery, objective findings, and goals for physical therapy Person educated: Patient Education method: Explanation Education comprehension: verbalized understanding  HOME EXERCISE PROGRAM:   ASSESSMENT:  CLINICAL IMPRESSION: Patient did an excellent job today progressing the the recumbent bike and weight machines performing without complaint and very good technique.  His CC is left medial knee pain prohibiting him from sleeping.  OBJECTIVE IMPAIRMENTS: Abnormal gait, decreased activity tolerance, decreased mobility, difficulty walking, decreased ROM, decreased strength, hypomobility, increased edema, and pain.   ACTIVITY LIMITATIONS: lifting, sitting, standing, squatting, sleeping, stairs, transfers, bed mobility, and locomotion level  PARTICIPATION LIMITATIONS: meal prep, cleaning, laundry, driving, shopping, community activity, and yard work  PERSONAL FACTORS: 3+ comorbidities: Arthritis, current smoker, diabetes, and neuropathy  are also affecting patient's functional outcome.   REHAB POTENTIAL: Good  CLINICAL DECISION  MAKING: Evolving/moderate complexity  EVALUATION COMPLEXITY: Moderate   GOALS: Goals reviewed with  patient? Yes  SHORT TERM GOALS: Target date: 11/21/23 Patient will be independent with his initial HEP.  Baseline: Goal status: MET  2.  Patient will improve his active left knee extension to within 5 degrees of neutral for improved gait mechanics. Baseline: 3/27: full extension Goal status: MET  3.  Patient will improve his LEFS score to at least 26/80. Baseline: 32/80 Goal status: MET  LONG TERM GOALS: Target date: 12/12/23  Patient will be independent with his advanced HEP.  Baseline:  Goal status: IN PROGRESS  2.  Patient will improve his active left knee flexion to at least 120 degrees for improved function navigating stairs. Baseline: 3/27: 126 degrees Goal status: MET  3.  Patient will be able to ambulate with minimal to no significant gait deviations for improved mobility. Baseline:  Goal status: IN PROGRESS  4.  Patient will be able to navigate at least 3 steps with a reciprocal pattern for improved household mobility. Baseline:  Goal status: IN PROGRESS  5.  Patient will improve his LEFS score to at least 40/80. Baseline:  Goal status: IN PROGRESS  PLAN:  PT FREQUENCY: 2x/week  PT DURATION: 6 weeks  PLANNED INTERVENTIONS: 97164- PT Re-evaluation, 97110-Therapeutic exercises, 97530- Therapeutic activity, 97112- Neuromuscular re-education, 97535- Self Care, 16109- Manual therapy, [phone]- Gait training, [phone]- Electrical stimulation (unattended), 97016- Vasopneumatic device, Patient/Family education, Balance training, Stair training, Joint mobilization, Cryotherapy, and Moist heat  PLAN FOR NEXT SESSION: Nustep, isometrics, rocker board, manual therapy, and modalities as needed   Janaki Exley, [place], PT 11/19/2023, 10:39 AM

## 2023-11-21 ENCOUNTER — Ambulatory Visit

## 2023-11-21 DIAGNOSIS — M25662 Stiffness of left knee, not elsewhere classified: Secondary | ICD-10-CM

## 2023-11-21 DIAGNOSIS — R600 Localized edema: Secondary | ICD-10-CM

## 2023-11-21 DIAGNOSIS — M25562 Pain in left knee: Secondary | ICD-10-CM | POA: Diagnosis not present

## 2023-11-21 NOTE — Therapy (Signed)
 OUTPATIENT PHYSICAL THERAPY LOWER EXTREMITY TREATMENT   Patient Name: Christopher Daniel MRN: [id number] DOB:08/31/1950, 73 y.o., male Today's Date: 11/21/2023  END OF SESSION:  PT End of Session - 11/21/23 0943     Visit Number 7    Number of Visits 12    Date for PT Re-Evaluation 01/17/24    Authorization - Number of Visits 12    PT Start Time 0930    PT Stop Time 1015    PT Time Calculation (min) 45 min    Activity Tolerance Patient tolerated treatment well    Behavior During Therapy WFL for tasks assessed/performed             Past Medical History:  Diagnosis Date   Arthritis    Diabetes mellitus without complication (HCC)    does not take medication or check sugars   GERD (gastroesophageal reflux disease)    Neuromuscular disorder (HCC)    poly neuropathy   Past Surgical History:  Procedure Laterality Date   EYE SURGERY Bilateral    eyelid   HERNIA REPAIR     LUMBAR LAMINECTOMY/DECOMPRESSION MICRODISCECTOMY Right 02/11/2020   Procedure: Laminectomy for facet/synovial cyst - right - Lumbar Five-Sacral One;  Surgeon: Julio Sicks, MD;  Location: [facility] OR;  Service: Neurosurgery;  Laterality: Right;  Laminectomy for facet/synovial cyst - right - Lumbar Five-Sacral One   Patient Active Problem List   Diagnosis Date Noted   Synovial cyst of lumbar facet joint 02/11/2020   REFERRING PROVIDER: Dannielle Huh, MD   REFERRING DIAG: S/P Left Partial Knee Replacement-DOS 10/23/23   THERAPY DIAG:  Acute pain of left knee  Stiffness of left knee, not elsewhere classified  Localized edema  Rationale for Evaluation and Treatment: Rehabilitation  ONSET DATE: 10/23/23  SUBJECTIVE:   SUBJECTIVE STATEMENT: Pt reports 2-3/10 left knee pain today, but continues to have issues sleeping  PERTINENT HISTORY: Arthritis, current smoker, diabetes, and neuropathy PAIN:  Are you having pain? Yes: NPRS scale: 2-3/10 Pain location: left knee Pain description: burning Aggravating factors:  getting up in the morning, any activity Relieving factors: medication  PRECAUTIONS: None  RED FLAGS: None   WEIGHT BEARING RESTRICTIONS: No  FALLS:  Has patient fallen in last 6 months? No  LIVING ENVIRONMENT: Lives with: lives with their spouse Lives in: House/apartment Stairs: Yes: Internal: 15-20 steps; on left going up; has not tried going up or down steps since surgery Has following equipment at home: Walker - 2 wheeled, Grab bars, and Ramped entry  OCCUPATION: retired  PLOF: Independent  PATIENT GOALS: reduced pain, improved mobility, play golf, be able to get into and out of the boat to fish  NEXT MD VISIT: 10/31/23  OBJECTIVE:  Note: Objective measures were completed at Evaluation unless otherwise noted.  PATIENT SURVEYS:  LEFS 16/80  COGNITION: Overall cognitive status: Within functional limits for tasks assessed     SENSATION: Patient reports tingling from neuropathy.   EDEMA:  Circumferential: Left tibiofemoral joint line: 44.7 cm Right tibiofemoral joint line: 39.5 cm  PALPATION: TTP: left medial tibiofemoral joint line  LOWER EXTREMITY ROM:  Active ROM Right eval Left eval  Hip flexion    Hip extension    Hip abduction    Hip adduction    Hip internal rotation    Hip external rotation    Knee flexion 138 96 PROM: 99  Knee extension 0 11  Ankle dorsiflexion    Ankle plantarflexion    Ankle inversion    Ankle eversion     (  Blank rows = not tested)  LOWER EXTREMITY MMT: not tested due to surgical condition  GAIT: Assistive device utilized: None Level of assistance: Complete Independence Comments: step through pattern with left knee flexed in stance phase, decreased stride length, and gait speed                                                                                                                                 TREATMENT DATE:    11/21/23:                                     EXERCISE LOG  Exercise Repetitions and  Resistance Comments  Nustep     Recumbent bike  Lvl 3 x 15 mins; seat 9-8   Knee ext 10# x 3.5 minutes   Ham curls 40# x 3.5 minutes   Forward Steps Ups 8" box x 25 reps   Standing Marches Airex x 2.5 mins   Lunges 14" box x 5 mins     11/14/23                                  EXERCISE LOG  Exercise Repetitions and Resistance Comments  Nustep Lvl 4 x 15 mins; seat 11-9   Rockerboard 5 mins   Lunges 14" box x 4 mins   Forward Step Ups 6" box x 30 reps   Heel Slides    HS    QS    LAQ 3# x 25 reps   Seated marches 3 # x 25 reps   Seated ham curls Red x 25 reps        Blank cell = exercise not performed today   Modalities  Date:  Unattended Estim: Knee, IFC 80-150 Hz, 15 mins, Pain Vaso: Knee, 34 degrees; low pressure, 15 mins, Pain and Edema   PATIENT EDUCATION:  Education details: plan of care, prognosis, healing, expectations following surgery, objective findings, and goals for physical therapy Person educated: Patient Education method: Explanation Education comprehension: verbalized understanding  HOME EXERCISE PROGRAM:   ASSESSMENT:  CLINICAL IMPRESSION: Pt arrives for today's treatment session reporting 2-3/10 left knee pain.  Pt able to tolerate progression to seat 8 on the recumbent bike today with minimal discomfort.  Pt able to navigate four stairs with reciprocal pattern and without use of handrail, meeting his LTG.  Pt able to tolerate increased time with all previously performed exercises including cybex exercises.  Pt also able to tolerate increased step height with forward step ups.  Pt declined modalities today as he does not think he needs them at this time.  Pt denied any change in pain at completion of today's treatment session.  OBJECTIVE IMPAIRMENTS: Abnormal gait, decreased activity tolerance, decreased mobility, difficulty walking, decreased ROM, decreased strength, hypomobility, increased edema,  and pain.   ACTIVITY LIMITATIONS: lifting, sitting,  standing, squatting, sleeping, stairs, transfers, bed mobility, and locomotion level  PARTICIPATION LIMITATIONS: meal prep, cleaning, laundry, driving, shopping, community activity, and yard work  PERSONAL FACTORS: 3+ comorbidities: Arthritis, current smoker, diabetes, and neuropathy  are also affecting patient's functional outcome.   REHAB POTENTIAL: Good  CLINICAL DECISION MAKING: Evolving/moderate complexity  EVALUATION COMPLEXITY: Moderate   GOALS: Goals reviewed with patient? Yes  SHORT TERM GOALS: Target date: 11/21/23 Patient will be independent with his initial HEP.  Baseline: Goal status: MET  2.  Patient will improve his active left knee extension to within 5 degrees of neutral for improved gait mechanics. Baseline: 3/27: full extension Goal status: MET  3.  Patient will improve his LEFS score to at least 26/80. Baseline: 32/80 Goal status: MET  LONG TERM GOALS: Target date: 12/12/23  Patient will be independent with his advanced HEP.  Baseline:  Goal status: IN PROGRESS  2.  Patient will improve his active left knee flexion to at least 120 degrees for improved function navigating stairs. Baseline: 3/27: 126 degrees Goal status: MET  3.  Patient will be able to ambulate with minimal to no significant gait deviations for improved mobility. Baseline:  Goal status: IN PROGRESS  4.  Patient will be able to navigate at least 3 steps with a reciprocal pattern for improved household mobility. Baseline:  Goal status: MET  5.  Patient will improve his LEFS score to at least 40/80. Baseline:  Goal status: IN PROGRESS  PLAN:  PT FREQUENCY: 2x/week  PT DURATION: 6 weeks  PLANNED INTERVENTIONS: 97164- PT Re-evaluation, 97110-Therapeutic exercises, 97530- Therapeutic activity, 97112- Neuromuscular re-education, 97535- Self Care, 78295- Manual therapy, [phone]- Gait training, [phone]- Electrical stimulation (unattended), 97016- Vasopneumatic device, Patient/Family  education, Balance training, Stair training, Joint mobilization, Cryotherapy, and Moist heat  PLAN FOR NEXT SESSION: Nustep, isometrics, rocker board, manual therapy, and modalities as needed   Newman Pies, PTA 11/21/2023, 11:24 AM

## 2023-11-26 ENCOUNTER — Ambulatory Visit

## 2023-11-26 DIAGNOSIS — M25562 Pain in left knee: Secondary | ICD-10-CM

## 2023-11-26 DIAGNOSIS — R600 Localized edema: Secondary | ICD-10-CM

## 2023-11-26 DIAGNOSIS — M25662 Stiffness of left knee, not elsewhere classified: Secondary | ICD-10-CM

## 2023-11-26 NOTE — Therapy (Signed)
 OUTPATIENT PHYSICAL THERAPY LOWER EXTREMITY TREATMENT   Patient Name: Christopher Daniel MRN: [id number] DOB:09-26-1950, 73 y.o., male Today's Date: 11/26/2023  END OF SESSION:  PT End of Session - 11/26/23 0932     Visit Number 8    Number of Visits 12    Date for PT Re-Evaluation 01/17/24    Authorization - Number of Visits 12    PT Start Time 0930    PT Stop Time 1012    PT Time Calculation (min) 42 min    Activity Tolerance Patient tolerated treatment well    Behavior During Therapy WFL for tasks assessed/performed             Past Medical History:  Diagnosis Date   Arthritis    Diabetes mellitus without complication (HCC)    does not take medication or check sugars   GERD (gastroesophageal reflux disease)    Neuromuscular disorder (HCC)    poly neuropathy   Past Surgical History:  Procedure Laterality Date   EYE SURGERY Bilateral    eyelid   HERNIA REPAIR     LUMBAR LAMINECTOMY/DECOMPRESSION MICRODISCECTOMY Right 02/11/2020   Procedure: Laminectomy for facet/synovial cyst - right - Lumbar Five-Sacral One;  Surgeon: Julio Sicks, MD;  Location: [facility] OR;  Service: Neurosurgery;  Laterality: Right;  Laminectomy for facet/synovial cyst - right - Lumbar Five-Sacral One   Patient Active Problem List   Diagnosis Date Noted   Synovial cyst of lumbar facet joint 02/11/2020   REFERRING PROVIDER: Dannielle Huh, MD   REFERRING DIAG: S/P Left Partial Knee Replacement-DOS 10/23/23   THERAPY DIAG:  Acute pain of left knee  Stiffness of left knee, not elsewhere classified  Localized edema  Rationale for Evaluation and Treatment: Rehabilitation  ONSET DATE: 10/23/23  SUBJECTIVE:   SUBJECTIVE STATEMENT: Pt reports 2/10 left knee pain today.  Pt had MD appointment on Thursday.  PERTINENT HISTORY: Arthritis, current smoker, diabetes, and neuropathy PAIN:  Are you having pain? Yes: NPRS scale: 2/10 Pain location: left knee Pain description: burning Aggravating factors:  getting up in the morning, any activity Relieving factors: medication  PRECAUTIONS: None  RED FLAGS: None   WEIGHT BEARING RESTRICTIONS: No  FALLS:  Has patient fallen in last 6 months? No  LIVING ENVIRONMENT: Lives with: lives with their spouse Lives in: House/apartment Stairs: Yes: Internal: 15-20 steps; on left going up; has not tried going up or down steps since surgery Has following equipment at home: Walker - 2 wheeled, Grab bars, and Ramped entry  OCCUPATION: retired  PLOF: Independent  PATIENT GOALS: reduced pain, improved mobility, play golf, be able to get into and out of the boat to fish  NEXT MD VISIT: 10/31/23  OBJECTIVE:  Note: Objective measures were completed at Evaluation unless otherwise noted.  PATIENT SURVEYS:  LEFS 16/80  COGNITION: Overall cognitive status: Within functional limits for tasks assessed     SENSATION: Patient reports tingling from neuropathy.   EDEMA:  Circumferential: Left tibiofemoral joint line: 44.7 cm Right tibiofemoral joint line: 39.5 cm  PALPATION: TTP: left medial tibiofemoral joint line  LOWER EXTREMITY ROM:  Active ROM Right eval Left eval  Hip flexion    Hip extension    Hip abduction    Hip adduction    Hip internal rotation    Hip external rotation    Knee flexion 138 96 PROM: 99  Knee extension 0 11  Ankle dorsiflexion    Ankle plantarflexion    Ankle inversion    Ankle eversion     (  Blank rows = not tested)  LOWER EXTREMITY MMT: not tested due to surgical condition  GAIT: Assistive device utilized: None Level of assistance: Complete Independence Comments: step through pattern with left knee flexed in stance phase, decreased stride length, and gait speed                                                                                                                                 TREATMENT DATE:    11/26/23:                                   EXERCISE LOG  Exercise Repetitions and Resistance  Comments  Nustep     Recumbent bike  Lvl 4 x 15 mins; seat 7   Knee ext 10# x 4 minutes   Ham curls 40# x 4 minutes   Leg press 3 plate; seat 9 x 3 mins   Forward Steps Ups 8" box x 25 reps   Standing Marches Airex x 3 mins   Lunges 14" box x 5 mins   Rockerboard  3 mins     11/14/23                                  EXERCISE LOG  Exercise Repetitions and Resistance Comments  Nustep Lvl 4 x 15 mins; seat 11-9   Rockerboard 5 mins   Lunges 14" box x 4 mins   Forward Step Ups 6" box x 30 reps   Heel Slides    HS    QS    LAQ 3# x 25 reps   Seated marches 3 # x 25 reps   Seated ham curls Red x 25 reps        Blank cell = exercise not performed today   Modalities  Date:  Unattended Estim: Knee, IFC 80-150 Hz, 15 mins, Pain Vaso: Knee, 34 degrees; low pressure, 15 mins, Pain and Edema   PATIENT EDUCATION:  Education details: plan of care, prognosis, healing, expectations following surgery, objective findings, and goals for physical therapy Person educated: Patient Education method: Explanation Education comprehension: verbalized understanding  HOME EXERCISE PROGRAM:   ASSESSMENT:  CLINICAL IMPRESSION: Pt arrives for today's treatment session reporting 2/10 left knee pain.  Pt able to reach 52/80 on LEFS questionnaire well surpassing his LTG.   Pt able to demonstrate 132 degrees of left knee flexion today without pain or discomfort.  Pt has met all the goals set forth for him by physical therapy at this time.  Pt able to tolerate increased time with all cybex exercises today while being introduced leg press.  Pt requiring min cues for eccentric control with good carry over.  Pt has an MD appointment on Thursday and will call the facility regarding his continuation of therapy.  Pt  reported very minimal pain at completion of today's treatment session.  OBJECTIVE IMPAIRMENTS: Abnormal gait, decreased activity tolerance, decreased mobility, difficulty walking, decreased ROM,  decreased strength, hypomobility, increased edema, and pain.   ACTIVITY LIMITATIONS: lifting, sitting, standing, squatting, sleeping, stairs, transfers, bed mobility, and locomotion level  PARTICIPATION LIMITATIONS: meal prep, cleaning, laundry, driving, shopping, community activity, and yard work  PERSONAL FACTORS: 3+ comorbidities: Arthritis, current smoker, diabetes, and neuropathy  are also affecting patient's functional outcome.   REHAB POTENTIAL: Good  CLINICAL DECISION MAKING: Evolving/moderate complexity  EVALUATION COMPLEXITY: Moderate   GOALS: Goals reviewed with patient? Yes  SHORT TERM GOALS: Target date: 11/21/23 Patient will be independent with his initial HEP.  Baseline: Goal status: MET  2.  Patient will improve his active left knee extension to within 5 degrees of neutral for improved gait mechanics. Baseline: 3/27: full extension Goal status: MET  3.  Patient will improve his LEFS score to at least 26/80. Baseline: 32/80 Goal status: MET  LONG TERM GOALS: Target date: 12/12/23  Patient will be independent with his advanced HEP.  Baseline:  Goal status: MET  2.  Patient will improve his active left knee flexion to at least 120 degrees for improved function navigating stairs. Baseline: 3/27: 126 degrees Goal status: MET  3.  Patient will be able to ambulate with minimal to no significant gait deviations for improved mobility. Baseline:  Goal status: MET  4.  Patient will be able to navigate at least 3 steps with a reciprocal pattern for improved household mobility. Baseline:  Goal status: MET  5.  Patient will improve his LEFS score to at least 40/80. Baseline: 4/8: 52/80  Goal status: MET  PLAN:  PT FREQUENCY: 2x/week  PT DURATION: 6 weeks  PLANNED INTERVENTIONS: 16109- PT Re-evaluation, 97110-Therapeutic exercises, 97530- Therapeutic activity, 97112- Neuromuscular re-education, 97535- Self Care, 60454- Manual therapy, [phone]- Gait training,  [phone]- Electrical stimulation (unattended), 97016- Vasopneumatic device, Patient/Family education, Balance training, Stair training, Joint mobilization, Cryotherapy, and Moist heat  PLAN FOR NEXT SESSION: Nustep, isometrics, rocker board, manual therapy, and modalities as needed   Newman Pies, PTA 11/26/2023, 10:55 AM

## 2023-11-26 NOTE — Therapy (Incomplete Revision)
 OUTPATIENT PHYSICAL THERAPY LOWER EXTREMITY TREATMENT   Patient Name: Christopher Daniel MRN: [id number] DOB:Jan 14, 1951, 73 y.o., male Today's Date: 11/26/2023  END OF SESSION:  PT End of Session - 11/26/23 0932     Visit Number 8    Number of Visits 12    Date for PT Re-Evaluation 01/17/24    Authorization - Number of Visits 12    PT Start Time 0930    PT Stop Time 1012    PT Time Calculation (min) 42 min    Activity Tolerance Patient tolerated treatment well    Behavior During Therapy WFL for tasks assessed/performed             Past Medical History:  Diagnosis Date   Arthritis    Diabetes mellitus without complication (HCC)    does not take medication or check sugars   GERD (gastroesophageal reflux disease)    Neuromuscular disorder (HCC)    poly neuropathy   Past Surgical History:  Procedure Laterality Date   EYE SURGERY Bilateral    eyelid   HERNIA REPAIR     LUMBAR LAMINECTOMY/DECOMPRESSION MICRODISCECTOMY Right 02/11/2020   Procedure: Laminectomy for facet/synovial cyst - right - Lumbar Five-Sacral One;  Surgeon: Julio Sicks, MD;  Location: [facility] OR;  Service: Neurosurgery;  Laterality: Right;  Laminectomy for facet/synovial cyst - right - Lumbar Five-Sacral One   Patient Active Problem List   Diagnosis Date Noted   Synovial cyst of lumbar facet joint 02/11/2020   REFERRING PROVIDER: Dannielle Huh, MD   REFERRING DIAG: S/P Left Partial Knee Replacement-DOS 10/23/23   THERAPY DIAG:  Acute pain of left knee  Stiffness of left knee, not elsewhere classified  Localized edema  Rationale for Evaluation and Treatment: Rehabilitation  ONSET DATE: 10/23/23  SUBJECTIVE:   SUBJECTIVE STATEMENT: Pt reports 2/10 left knee pain today.  Pt had MD appointment on Thursday.  PERTINENT HISTORY: Arthritis, current smoker, diabetes, and neuropathy PAIN:  Are you having pain? Yes: NPRS scale: 2/10 Pain location: left knee Pain description: burning Aggravating factors:  getting up in the morning, any activity Relieving factors: medication  PRECAUTIONS: None  RED FLAGS: None   WEIGHT BEARING RESTRICTIONS: No  FALLS:  Has patient fallen in last 6 months? No  LIVING ENVIRONMENT: Lives with: lives with their spouse Lives in: House/apartment Stairs: Yes: Internal: 15-20 steps; on left going up; has not tried going up or down steps since surgery Has following equipment at home: Walker - 2 wheeled, Grab bars, and Ramped entry  OCCUPATION: retired  PLOF: Independent  PATIENT GOALS: reduced pain, improved mobility, play golf, be able to get into and out of the boat to fish  NEXT MD VISIT: 10/31/23  OBJECTIVE:  Note: Objective measures were completed at Evaluation unless otherwise noted.  PATIENT SURVEYS:  LEFS 16/80  COGNITION: Overall cognitive status: Within functional limits for tasks assessed     SENSATION: Patient reports tingling from neuropathy.   EDEMA:  Circumferential: Left tibiofemoral joint line: 44.7 cm Right tibiofemoral joint line: 39.5 cm  PALPATION: TTP: left medial tibiofemoral joint line  LOWER EXTREMITY ROM:  Active ROM Right eval Left eval  Hip flexion    Hip extension    Hip abduction    Hip adduction    Hip internal rotation    Hip external rotation    Knee flexion 138 96 PROM: 99  Knee extension 0 11  Ankle dorsiflexion    Ankle plantarflexion    Ankle inversion    Ankle eversion     (  Blank rows = not tested)  LOWER EXTREMITY MMT: not tested due to surgical condition  GAIT: Assistive device utilized: None Level of assistance: Complete Independence Comments: step through pattern with left knee flexed in stance phase, decreased stride length, and gait speed                                                                                                                                 TREATMENT DATE:    11/26/23:                                   EXERCISE LOG  Exercise Repetitions and Resistance  Comments  Nustep     Recumbent bike  Lvl 4 x 15 mins; seat 7   Knee ext 10# x 4 minutes   Ham curls 40# x 4 minutes   Leg press 3 plate; seat 9 x 3 mins   Forward Steps Ups 8" box x 25 reps   Standing Marches Airex x 3 mins   Lunges 14" box x 5 mins   Rockerboard  3 mins     11/14/23                                  EXERCISE LOG  Exercise Repetitions and Resistance Comments  Nustep Lvl 4 x 15 mins; seat 11-9   Rockerboard 5 mins   Lunges 14" box x 4 mins   Forward Step Ups 6" box x 30 reps   Heel Slides    HS    QS    LAQ 3# x 25 reps   Seated marches 3 # x 25 reps   Seated ham curls Red x 25 reps        Blank cell = exercise not performed today   Modalities  Date:  Unattended Estim: Knee, IFC 80-150 Hz, 15 mins, Pain Vaso: Knee, 34 degrees; low pressure, 15 mins, Pain and Edema   PATIENT EDUCATION:  Education details: plan of care, prognosis, healing, expectations following surgery, objective findings, and goals for physical therapy Person educated: Patient Education method: Explanation Education comprehension: verbalized understanding  HOME EXERCISE PROGRAM:   ASSESSMENT:  CLINICAL IMPRESSION: Pt arrives for today's treatment session reporting 2/10 left knee pain.  Pt able to reach 52/80 on LEFS questionnaire well surpassing his LTG.   Pt able to demonstrate 132 degrees of left knee flexion today without pain or discomfort.  Pt has met all the goals set forth for him by physical therapy at this time.  Pt able to tolerate increased time with all cybex exercises today while being introduced leg press.  Pt requiring min cues for eccentric control with good carry over.  Pt has an MD appointment on Thursday and will call the facility regarding his continuation of therapy.  Pt  reported very minimal pain at completion of today's treatment session.  11/26/23 PROGRESS REPORT:   Patient is making excellent progress with skilled physical therapy as evidenced by his objective  measures, functional mobility, and progress toward his goals. He was able to meet all of his goals for skilled physical therapy. He will be placed on hold until he follows   OBJECTIVE IMPAIRMENTS: Abnormal gait, decreased activity tolerance, decreased mobility, difficulty walking, decreased ROM, decreased strength, hypomobility, increased edema, and pain.   ACTIVITY LIMITATIONS: lifting, sitting, standing, squatting, sleeping, stairs, transfers, bed mobility, and locomotion level  PARTICIPATION LIMITATIONS: meal prep, cleaning, laundry, driving, shopping, community activity, and yard work  PERSONAL FACTORS: 3+ comorbidities: Arthritis, current smoker, diabetes, and neuropathy  are also affecting patient's functional outcome.   REHAB POTENTIAL: Good  CLINICAL DECISION MAKING: Evolving/moderate complexity  EVALUATION COMPLEXITY: Moderate   GOALS: Goals reviewed with patient? Yes  SHORT TERM GOALS: Target date: 11/21/23 Patient will be independent with his initial HEP.  Baseline: Goal status: MET  2.  Patient will improve his active left knee extension to within 5 degrees of neutral for improved gait mechanics. Baseline: 3/27: full extension Goal status: MET  3.  Patient will improve his LEFS score to at least 26/80. Baseline: 32/80 Goal status: MET  LONG TERM GOALS: Target date: 12/12/23  Patient will be independent with his advanced HEP.  Baseline:  Goal status: MET  2.  Patient will improve his active left knee flexion to at least 120 degrees for improved function navigating stairs. Baseline: 3/27: 126 degrees Goal status: MET  3.  Patient will be able to ambulate with minimal to no significant gait deviations for improved mobility. Baseline:  Goal status: MET  4.  Patient will be able to navigate at least 3 steps with a reciprocal pattern for improved household mobility. Baseline:  Goal status: MET  5.  Patient will improve his LEFS score to at least 40/80. Baseline:  4/8: 52/80  Goal status: MET  PLAN:  PT FREQUENCY: 2x/week  PT DURATION: 6 weeks  PLANNED INTERVENTIONS: 16109- PT Re-evaluation, 97110-Therapeutic exercises, 97530- Therapeutic activity, 97112- Neuromuscular re-education, 97535- Self Care, 60454- Manual therapy, [phone]- Gait training, [phone]- Electrical stimulation (unattended), 97016- Vasopneumatic device, Patient/Family education, Balance training, Stair training, Joint mobilization, Cryotherapy, and Moist heat  PLAN FOR NEXT SESSION: Nustep, isometrics, rocker board, manual therapy, and modalities as needed   Newman Pies, PTA 11/26/2023, 10:55 AM

## 2023-11-28 ENCOUNTER — Ambulatory Visit: Admitting: *Deleted

## 2023-11-28 ENCOUNTER — Encounter: Payer: Self-pay | Admitting: *Deleted

## 2023-11-28 DIAGNOSIS — M25562 Pain in left knee: Secondary | ICD-10-CM | POA: Diagnosis not present

## 2023-11-28 DIAGNOSIS — R600 Localized edema: Secondary | ICD-10-CM

## 2023-11-28 DIAGNOSIS — M25662 Stiffness of left knee, not elsewhere classified: Secondary | ICD-10-CM

## 2023-11-28 NOTE — Therapy (Signed)
 OUTPATIENT PHYSICAL THERAPY LOWER EXTREMITY TREATMENT   Patient Name: Christopher Daniel MRN: [id number] DOB:09-14-1950, 73 y.o., male Today's Date: 11/28/2023  END OF SESSION:  PT End of Session - 11/28/23 1356     Visit Number 9    Number of Visits 12    Date for PT Re-Evaluation 01/17/24    Authorization - Number of Visits 12    PT Start Time 1345    PT Stop Time 1435    PT Time Calculation (min) 50 min             Past Medical History:  Diagnosis Date   Arthritis    Diabetes mellitus without complication (HCC)    does not take medication or check sugars   GERD (gastroesophageal reflux disease)    Neuromuscular disorder (HCC)    poly neuropathy   Past Surgical History:  Procedure Laterality Date   EYE SURGERY Bilateral    eyelid   HERNIA REPAIR     LUMBAR LAMINECTOMY/DECOMPRESSION MICRODISCECTOMY Right 02/11/2020   Procedure: Laminectomy for facet/synovial cyst - right - Lumbar Five-Sacral One;  Surgeon: Julio Sicks, MD;  Location: [facility] OR;  Service: Neurosurgery;  Laterality: Right;  Laminectomy for facet/synovial cyst - right - Lumbar Five-Sacral One   Patient Active Problem List   Diagnosis Date Noted   Synovial cyst of lumbar facet joint 02/11/2020   REFERRING PROVIDER: Dannielle Huh, MD   REFERRING DIAG: S/P Left Partial Knee Replacement-DOS 10/23/23   THERAPY DIAG:  Acute pain of left knee  Stiffness of left knee, not elsewhere classified  Localized edema  Rationale for Evaluation and Treatment: Rehabilitation  ONSET DATE: 10/23/23  SUBJECTIVE:   SUBJECTIVE STATEMENT: Pt reports 2/10 left knee. Dr said to continue with PT and finish PT  PERTINENT HISTORY: Arthritis, current smoker, diabetes, and neuropathy PAIN:  Are you having pain? Yes: NPRS scale: 2/10 Pain location: left knee Pain description: burning Aggravating factors: getting up in the morning, any activity Relieving factors: medication  PRECAUTIONS: None  RED FLAGS: None   WEIGHT  BEARING RESTRICTIONS: No  FALLS:  Has patient fallen in last 6 months? No  LIVING ENVIRONMENT: Lives with: lives with their spouse Lives in: House/apartment Stairs: Yes: Internal: 15-20 steps; on left going up; has not tried going up or down steps since surgery Has following equipment at home: Walker - 2 wheeled, Grab bars, and Ramped entry  OCCUPATION: retired  PLOF: Independent  PATIENT GOALS: reduced pain, improved mobility, play golf, be able to get into and out of the boat to fish  NEXT MD VISIT: 10/31/23  OBJECTIVE:  Note: Objective measures were completed at Evaluation unless otherwise noted.  PATIENT SURVEYS:  LEFS 16/80  COGNITION: Overall cognitive status: Within functional limits for tasks assessed     SENSATION: Patient reports tingling from neuropathy.   EDEMA:  Circumferential: Left tibiofemoral joint line: 44.7 cm Right tibiofemoral joint line: 39.5 cm  PALPATION: TTP: left medial tibiofemoral joint line  LOWER EXTREMITY ROM:  Active ROM Right eval Left eval  Hip flexion    Hip extension    Hip abduction    Hip adduction    Hip internal rotation    Hip external rotation    Knee flexion 138 96 PROM: 99  Knee extension 0 11  Ankle dorsiflexion    Ankle plantarflexion    Ankle inversion    Ankle eversion     (Blank rows = not tested)  LOWER EXTREMITY MMT: not tested due to surgical condition  GAIT: Assistive device utilized: None Level of assistance: Complete Independence Comments: step through pattern with left knee flexed in stance phase, decreased stride length, and gait speed                                                                                                                                 TREATMENT DATE:    11/28/23:                                   EXERCISE LOG      LT knee  Exercise Repetitions and Resistance Comments  Nustep     Recumbent bike  Lvl 4 x 15 mins; seat 7   Knee ext 20# x 4 minutes   Ham curls 40# x  4 minutes   Leg press 3 plate; seat 9 x 3 mins   Forward lunge 14in box  with focus on quad control.    Standing Marches    Lunges    Rockerboard  3 mins     11/14/23                                  EXERCISE LOG  Exercise Repetitions and Resistance Comments  Nustep Lvl 4 x 15 mins; seat 11-9   Rockerboard 5 mins   Lunges 14" box x 4 mins   Forward Step Ups 6" box x 30 reps   Heel Slides    HS    QS    LAQ 3# x 25 reps   Seated marches 3 # x 25 reps   Seated ham curls Red x 25 reps        Blank cell = exercise not performed today   Modalities  Date:  Unattended Estim: Knee, IFC 80-150 Hz, 15 mins, Pain Vaso: Knee, 34 degrees; low pressure, 15 mins, Pain and Edema   PATIENT EDUCATION:  Education details: plan of care, prognosis, healing, expectations following surgery, objective findings, and goals for physical therapy Person educated: Patient Education method: Explanation Education comprehension: verbalized understanding  HOME EXERCISE PROGRAM:   ASSESSMENT:  CLINICAL IMPRESSION: Pt arrives for today's treatment session reporting 2/10 left knee pain but increases with kneeling position to 7/10 when trying to get up . Pt reports he would like to be able to kneel down and get up with less pain. Rx focused on LE strengthening as well as LT knee quad strengthening in lunge position on 14 in box to scale back kneeling position and focus on quad control.Pt did great with mainly soreness and some anterior knee pain.      OBJECTIVE IMPAIRMENTS: Abnormal gait, decreased activity tolerance, decreased mobility, difficulty walking, decreased ROM, decreased strength, hypomobility, increased edema, and pain.   ACTIVITY LIMITATIONS: lifting, sitting, standing, squatting, sleeping, stairs, transfers, bed mobility, and locomotion  level  PARTICIPATION LIMITATIONS: meal prep, cleaning, laundry, driving, shopping, community activity, and yard work  PERSONAL FACTORS: 3+ comorbidities:  Arthritis, current smoker, diabetes, and neuropathy  are also affecting patient's functional outcome.   REHAB POTENTIAL: Good  CLINICAL DECISION MAKING: Evolving/moderate complexity  EVALUATION COMPLEXITY: Moderate   GOALS: Goals reviewed with patient? Yes  SHORT TERM GOALS: Target date: 11/21/23 Patient will be independent with his initial HEP.  Baseline: Goal status: MET  2.  Patient will improve his active left knee extension to within 5 degrees of neutral for improved gait mechanics. Baseline: 3/27: full extension Goal status: MET  3.  Patient will improve his LEFS score to at least 26/80. Baseline: 32/80 Goal status: MET  LONG TERM GOALS: Target date: 12/12/23  Patient will be independent with his advanced HEP.  Baseline:  Goal status: MET  2.  Patient will improve his active left knee flexion to at least 120 degrees for improved function navigating stairs. Baseline: 3/27: 126 degrees Goal status: MET  3.  Patient will be able to ambulate with minimal to no significant gait deviations for improved mobility. Baseline:  Goal status: MET  4.  Patient will be able to navigate at least 3 steps with a reciprocal pattern for improved household mobility. Baseline:  Goal status: MET  5.  Patient will improve his LEFS score to at least 40/80. Baseline: 4/8: 52/80  Goal status: MET  PLAN:  PT FREQUENCY: 2x/week  PT DURATION: 6 weeks  PLANNED INTERVENTIONS: 97164- PT Re-evaluation, 97110-Therapeutic exercises, 97530- Therapeutic activity, 97112- Neuromuscular re-education, 97535- Self Care, 78295- Manual therapy, [phone]- Gait training, [phone]- Electrical stimulation (unattended), 97016- Vasopneumatic device, Patient/Family education, Balance training, Stair training, Joint mobilization, Cryotherapy, and Moist heat  PLAN FOR NEXT SESSION: Nustep, LE strengthening. 2 visits left   Korde Jeppsen,CHRIS, PTA 11/28/2023, 5:26 PM

## 2023-12-03 ENCOUNTER — Ambulatory Visit

## 2023-12-03 DIAGNOSIS — M25562 Pain in left knee: Secondary | ICD-10-CM

## 2023-12-03 DIAGNOSIS — R600 Localized edema: Secondary | ICD-10-CM

## 2023-12-03 DIAGNOSIS — M25662 Stiffness of left knee, not elsewhere classified: Secondary | ICD-10-CM

## 2023-12-03 NOTE — Therapy (Signed)
 OUTPATIENT PHYSICAL THERAPY LOWER EXTREMITY TREATMENT   Patient Name: Christopher Daniel MRN: [id number] DOB:1950-10-06, 73 y.o., male Today's Date: 12/03/2023  END OF SESSION:  PT End of Session - 12/03/23 0942     Visit Number 10    Number of Visits 12    Date for PT Re-Evaluation 01/17/24    Authorization - Number of Visits 12    PT Start Time 0930    PT Stop Time 1011    PT Time Calculation (min) 41 min    Activity Tolerance Patient tolerated treatment well    Behavior During Therapy WFL for tasks assessed/performed              Past Medical History:  Diagnosis Date   Arthritis    Diabetes mellitus without complication (HCC)    does not take medication or check sugars   GERD (gastroesophageal reflux disease)    Neuromuscular disorder (HCC)    poly neuropathy   Past Surgical History:  Procedure Laterality Date   EYE SURGERY Bilateral    eyelid   HERNIA REPAIR     LUMBAR LAMINECTOMY/DECOMPRESSION MICRODISCECTOMY Right 02/11/2020   Procedure: Laminectomy for facet/synovial cyst - right - Lumbar Five-Sacral One;  Surgeon: Julio Sicks, MD;  Location: [facility] OR;  Service: Neurosurgery;  Laterality: Right;  Laminectomy for facet/synovial cyst - right - Lumbar Five-Sacral One   Patient Active Problem List   Diagnosis Date Noted   Synovial cyst of lumbar facet joint 02/11/2020   REFERRING PROVIDER: Dannielle Huh, MD   REFERRING DIAG: S/P Left Partial Knee Replacement-DOS 10/23/23   THERAPY DIAG:  Acute pain of left knee  Stiffness of left knee, not elsewhere classified  Localized edema  Rationale for Evaluation and Treatment: Rehabilitation  ONSET DATE: 10/23/23  SUBJECTIVE:   SUBJECTIVE STATEMENT: Patient reports that he is not really hurting much today.   PERTINENT HISTORY: Arthritis, current smoker, diabetes, and neuropathy PAIN:  Are you having pain? Yes: NPRS scale: 1/10 Pain location: left knee Pain description: burning Aggravating factors: getting up in  the morning, any activity Relieving factors: medication  PRECAUTIONS: None  RED FLAGS: None   WEIGHT BEARING RESTRICTIONS: No  FALLS:  Has patient fallen in last 6 months? No  LIVING ENVIRONMENT: Lives with: lives with their spouse Lives in: House/apartment Stairs: Yes: Internal: 15-20 steps; on left going up; has not tried going up or down steps since surgery Has following equipment at home: Walker - 2 wheeled, Grab bars, and Ramped entry  OCCUPATION: retired  PLOF: Independent  PATIENT GOALS: reduced pain, improved mobility, play golf, be able to get into and out of the boat to fish  NEXT MD VISIT: 10/31/23  OBJECTIVE:  Note: Objective measures were completed at Evaluation unless otherwise noted.  PATIENT SURVEYS:  LEFS 16/80  COGNITION: Overall cognitive status: Within functional limits for tasks assessed     SENSATION: Patient reports tingling from neuropathy.   EDEMA:  Circumferential: Left tibiofemoral joint line: 44.7 cm Right tibiofemoral joint line: 39.5 cm  PALPATION: TTP: left medial tibiofemoral joint line  LOWER EXTREMITY ROM:  Active ROM Right eval Left eval  Hip flexion    Hip extension    Hip abduction    Hip adduction    Hip internal rotation    Hip external rotation    Knee flexion 138 96 PROM: 99  Knee extension 0 11  Ankle dorsiflexion    Ankle plantarflexion    Ankle inversion    Ankle eversion     (  Blank rows = not tested)  LOWER EXTREMITY MMT: not tested due to surgical condition  GAIT: Assistive device utilized: None Level of assistance: Complete Independence Comments: step through pattern with left knee flexed in stance phase, decreased stride length, and gait speed                                                                                                                                 TREATMENT DATE:                                    12/03/23 EXERCISE LOG  Exercise Repetitions and Resistance Comments   Recumbent bike  L4 x 15 minutes @ seat 7   Cybex knee extension 30# x 2 minutes   Cybex knee flexion 50# x 3 minutes   Cybex leg press  3.5 plates @ seat 7 x 2 minutes   Rocker board  5 minutes   Static stance on BOSU Ball down x 3 minutes Without UE support  Lunges onto step  14" step x 3 minutes  With focus on pushing up through LLE    Blank cell = exercise not performed today   11/28/23: EXERCISE LOG      LT knee  Exercise Repetitions and Resistance Comments  Nustep     Recumbent bike  Lvl 4 x 15 mins; seat 7   Knee ext 20# x 4 minutes   Ham curls 40# x 4 minutes   Leg press 3 plate; seat 9 x 3 mins   Forward lunge 14in box  with focus on quad control.    Standing Marches    Lunges    Rockerboard  3 mins     11/14/23                                  EXERCISE LOG  Exercise Repetitions and Resistance Comments  Nustep Lvl 4 x 15 mins; seat 11-9   Rockerboard 5 mins   Lunges 14" box x 4 mins   Forward Step Ups 6" box x 30 reps   Heel Slides    HS    QS    LAQ 3# x 25 reps   Seated marches 3 # x 25 reps   Seated ham curls Red x 25 reps        Blank cell = exercise not performed today   Modalities  Date:  Unattended Estim: Knee, IFC 80-150 Hz, 15 mins, Pain Vaso: Knee, 34 degrees; low pressure, 15 mins, Pain and Edema   PATIENT EDUCATION:  Education details: plan of care, prognosis, healing, expectations following surgery, objective findings, and goals for physical therapy Person educated: Patient Education method: Explanation Education comprehension: verbalized understanding  HOME EXERCISE PROGRAM:   ASSESSMENT:  CLINICAL IMPRESSION: Patient was progressed  with multiple new and familiar interventions for improved muscular strength and endurance. He required minimal cueing with today's interventions for proper pacing and exercise performance. He experienced a moderate increase in fatigue with today's progressions, but this did not limit his ability to complete  any of today's interventions. He reported that his knee felt good upon the conclusion of treatment. He continues to require skilled physical therapy to address his remaining impairments to return to his prior level of function.   OBJECTIVE IMPAIRMENTS: Abnormal gait, decreased activity tolerance, decreased mobility, difficulty walking, decreased ROM, decreased strength, hypomobility, increased edema, and pain.   ACTIVITY LIMITATIONS: lifting, sitting, standing, squatting, sleeping, stairs, transfers, bed mobility, and locomotion level  PARTICIPATION LIMITATIONS: meal prep, cleaning, laundry, driving, shopping, community activity, and yard work  PERSONAL FACTORS: 3+ comorbidities: Arthritis, current smoker, diabetes, and neuropathy  are also affecting patient's functional outcome.   REHAB POTENTIAL: Good  CLINICAL DECISION MAKING: Evolving/moderate complexity  EVALUATION COMPLEXITY: Moderate   GOALS: Goals reviewed with patient? Yes  SHORT TERM GOALS: Target date: 11/21/23 Patient will be independent with his initial HEP.  Baseline: Goal status: MET  2.  Patient will improve his active left knee extension to within 5 degrees of neutral for improved gait mechanics. Baseline: 3/27: full extension Goal status: MET  3.  Patient will improve his LEFS score to at least 26/80. Baseline: 32/80 Goal status: MET  LONG TERM GOALS: Target date: 12/12/23  Patient will be independent with his advanced HEP.  Baseline:  Goal status: MET  2.  Patient will improve his active left knee flexion to at least 120 degrees for improved function navigating stairs. Baseline: 3/27: 126 degrees Goal status: MET  3.  Patient will be able to ambulate with minimal to no significant gait deviations for improved mobility. Baseline:  Goal status: MET  4.  Patient will be able to navigate at least 3 steps with a reciprocal pattern for improved household mobility. Baseline:  Goal status: MET  5.  Patient  will improve his LEFS score to at least 40/80. Baseline: 4/8: 52/80  Goal status: MET  6.  Patient will be able to transfer from kneeling to standing with minimal to no difficulty for improved function gardening.  Baseline:  Goal status: INITIAL   PLAN:  PT FREQUENCY: 2x/week  PT DURATION: 6 weeks  PLANNED INTERVENTIONS: 97164- PT Re-evaluation, 97110-Therapeutic exercises, 97530- Therapeutic activity, [id number]- Neuromuscular re-education, 97535- Self Care, 82956- Manual therapy, [phone]- Gait training, [phone]- Electrical stimulation (unattended), 97016- Vasopneumatic device, Patient/Family education, Balance training, Stair training, Joint mobilization, Cryotherapy, and Moist heat  PLAN FOR NEXT SESSION: Nustep, LE strengthening. 2 visits left   Lane Pinon, PT 12/03/2023, 10:12 AM

## 2023-12-05 ENCOUNTER — Ambulatory Visit

## 2023-12-05 DIAGNOSIS — M25662 Stiffness of left knee, not elsewhere classified: Secondary | ICD-10-CM

## 2023-12-05 DIAGNOSIS — R600 Localized edema: Secondary | ICD-10-CM

## 2023-12-05 DIAGNOSIS — M25562 Pain in left knee: Secondary | ICD-10-CM

## 2023-12-05 NOTE — Therapy (Signed)
 OUTPATIENT PHYSICAL THERAPY LOWER EXTREMITY TREATMENT   Patient Name: Christopher Daniel MRN: [id number] DOB:Jan 13, 1951, 73 y.o., male Today's Date: 12/05/2023  END OF SESSION:  PT End of Session - 12/05/23 0934     Visit Number 11    Number of Visits 12    Date for PT Re-Evaluation 01/17/24    Authorization - Number of Visits 12    PT Start Time 0930    PT Stop Time 0957    PT Time Calculation (min) 27 min    Activity Tolerance Patient tolerated treatment well    Behavior During Therapy WFL for tasks assessed/performed               Past Medical History:  Diagnosis Date   Arthritis    Diabetes mellitus without complication (HCC)    does not take medication or check sugars   GERD (gastroesophageal reflux disease)    Neuromuscular disorder (HCC)    poly neuropathy   Past Surgical History:  Procedure Laterality Date   EYE SURGERY Bilateral    eyelid   HERNIA REPAIR     LUMBAR LAMINECTOMY/DECOMPRESSION MICRODISCECTOMY Right 02/11/2020   Procedure: Laminectomy for facet/synovial cyst - right - Lumbar Five-Sacral One;  Surgeon: Agustina Aldrich, MD;  Location: [facility] OR;  Service: Neurosurgery;  Laterality: Right;  Laminectomy for facet/synovial cyst - right - Lumbar Five-Sacral One   Patient Active Problem List   Diagnosis Date Noted   Synovial cyst of lumbar facet joint 02/11/2020   REFERRING PROVIDER: Christie Cox, MD   REFERRING DIAG: S/P Left Partial Knee Replacement-DOS 10/23/23   THERAPY DIAG:  Acute pain of left knee  Stiffness of left knee, not elsewhere classified  Localized edema  Rationale for Evaluation and Treatment: Rehabilitation  ONSET DATE: 10/23/23  SUBJECTIVE:   SUBJECTIVE STATEMENT: Patient reports that today is his last day of physical therapy as he is able to do about everything that he wants to at home. He can tell that his knee is getting better everyday.   PERTINENT HISTORY: Arthritis, current smoker, diabetes, and neuropathy PAIN:  Are you  having pain? Yes: NPRS scale: 1/10 Pain location: left knee Pain description: burning Aggravating factors: getting up in the morning, any activity Relieving factors: medication  PRECAUTIONS: None  RED FLAGS: None   WEIGHT BEARING RESTRICTIONS: No  FALLS:  Has patient fallen in last 6 months? No  LIVING ENVIRONMENT: Lives with: lives with their spouse Lives in: House/apartment Stairs: Yes: Internal: 15-20 steps; on left going up; has not tried going up or down steps since surgery Has following equipment at home: Walker - 2 wheeled, Grab bars, and Ramped entry  OCCUPATION: retired  PLOF: Independent  PATIENT GOALS: reduced pain, improved mobility, play golf, be able to get into and out of the boat to fish  NEXT MD VISIT: 10/31/23  OBJECTIVE:  Note: Objective measures were completed at Evaluation unless otherwise noted.  PATIENT SURVEYS:  LEFS 16/80  COGNITION: Overall cognitive status: Within functional limits for tasks assessed     SENSATION: Patient reports tingling from neuropathy.   EDEMA:  Circumferential: Left tibiofemoral joint line: 44.7 cm Right tibiofemoral joint line: 39.5 cm  PALPATION: TTP: left medial tibiofemoral joint line  LOWER EXTREMITY ROM:  Active ROM Right eval Left eval  Hip flexion    Hip extension    Hip abduction    Hip adduction    Hip internal rotation    Hip external rotation    Knee flexion 138 96 PROM: 99  Knee extension 0 11  Ankle dorsiflexion    Ankle plantarflexion    Ankle inversion    Ankle eversion     (Blank rows = not tested)  LOWER EXTREMITY MMT: not tested due to surgical condition  GAIT: Assistive device utilized: None Level of assistance: Complete Independence Comments: step through pattern with left knee flexed in stance phase, decreased stride length, and gait speed                                                                                                                                  TREATMENT DATE:                                     12/05/23 EXERCISE LOG  Exercise Repetitions and Resistance Comments  Recumbent bike L4 x 15 minutes   Kneeling  2 reps    Step up  8" x 2 x 1 minute   SLS on foam  3 x 10 seconds each  Intermittent UE support  Standing hip ABD  Red t-band x 3 x 12 reps  Alternating LE       Blank cell = exercise not performed today                                   12/03/23 EXERCISE LOG  Exercise Repetitions and Resistance Comments  Recumbent bike  L4 x 15 minutes @ seat 7   Cybex knee extension 30# x 2 minutes   Cybex knee flexion 50# x 3 minutes   Cybex leg press  3.5 plates @ seat 7 x 2 minutes   Rocker board  5 minutes   Static stance on BOSU Ball down x 3 minutes Without UE support  Lunges onto step  14" step x 3 minutes  With focus on pushing up through LLE    Blank cell = exercise not performed today   11/28/23: EXERCISE LOG      LT knee  Exercise Repetitions and Resistance Comments  Nustep     Recumbent bike  Lvl 4 x 15 mins; seat 7   Knee ext 20# x 4 minutes   Ham curls 40# x 4 minutes   Leg press 3 plate; seat 9 x 3 mins   Forward lunge 14in box  with focus on quad control.    Standing Marches    Lunges    Rockerboard  3 mins    PATIENT EDUCATION:  Education details: plan of care, prognosis, healing, expectations following surgery, objective findings, and goals for physical therapy Person educated: Patient Education method: Explanation Education comprehension: verbalized understanding  HOME EXERCISE PROGRAM:   ASSESSMENT:  CLINICAL IMPRESSION: Patient has made excellent progress with skilled physical therapy as evidenced by his subjective reports, objective measures, functional mobility, and  progress toward his goals. He was able to meet all of his goals for skilled physical therapy and he felt comfortable being discharged at this time.   PHYSICAL THERAPY DISCHARGE SUMMARY  Visits from Start of Care: 11  Current  functional level related to goals / functional outcomes: Patient was able to meet all of his goals for skilled physical therapy.    Remaining deficits: None    Education / Equipment: HEP    Patient agrees to discharge. Patient goals were met. Patient is being discharged due to meeting the stated rehab goals.   OBJECTIVE IMPAIRMENTS: Abnormal gait, decreased activity tolerance, decreased mobility, difficulty walking, decreased ROM, decreased strength, hypomobility, increased edema, and pain.   ACTIVITY LIMITATIONS: lifting, sitting, standing, squatting, sleeping, stairs, transfers, bed mobility, and locomotion level  PARTICIPATION LIMITATIONS: meal prep, cleaning, laundry, driving, shopping, community activity, and yard work  PERSONAL FACTORS: 3+ comorbidities: Arthritis, current smoker, diabetes, and neuropathy  are also affecting patient's functional outcome.   REHAB POTENTIAL: Good  CLINICAL DECISION MAKING: Evolving/moderate complexity  EVALUATION COMPLEXITY: Moderate   GOALS: Goals reviewed with patient? Yes  SHORT TERM GOALS: Target date: 11/21/23 Patient will be independent with his initial HEP.  Baseline: Goal status: MET  2.  Patient will improve his active left knee extension to within 5 degrees of neutral for improved gait mechanics. Baseline: 3/27: full extension Goal status: MET  3.  Patient will improve his LEFS score to at least 26/80. Baseline: 32/80 Goal status: MET  LONG TERM GOALS: Target date: 12/12/23  Patient will be independent with his advanced HEP.  Baseline:  Goal status: MET  2.  Patient will improve his active left knee flexion to at least 120 degrees for improved function navigating stairs. Baseline: 3/27: 126 degrees Goal status: MET  3.  Patient will be able to ambulate with minimal to no significant gait deviations for improved mobility. Baseline:  Goal status: MET  4.  Patient will be able to navigate at least 3 steps with a  reciprocal pattern for improved household mobility. Baseline:  Goal status: MET  5.  Patient will improve his LEFS score to at least 40/80. Baseline: 4/8: 52/80  Goal status: MET  6.  Patient will be able to transfer from kneeling to standing with minimal to no difficulty for improved function gardening.  Baseline:  Goal status: MET   PLAN:  PT FREQUENCY: 2x/week  PT DURATION: 6 weeks  PLANNED INTERVENTIONS: 97164- PT Re-evaluation, 97110-Therapeutic exercises, 97530- Therapeutic activity, [id number]- Neuromuscular re-education, 97535- Self Care, 82956- Manual therapy, [phone]- Gait training, [phone]- Electrical stimulation (unattended), 97016- Vasopneumatic device, Patient/Family education, Balance training, Stair training, Joint mobilization, Cryotherapy, and Moist heat  PLAN FOR NEXT SESSION: Nustep, LE strengthening. 2 visits left   Lane Pinon, PT 12/05/2023, 10:11 AM

## 2024-01-20 ENCOUNTER — Encounter: Payer: Self-pay | Admitting: Physical Therapy

## 2024-01-20 ENCOUNTER — Ambulatory Visit: Attending: Orthopedic Surgery | Admitting: Physical Therapy

## 2024-01-20 ENCOUNTER — Other Ambulatory Visit: Payer: Self-pay

## 2024-01-20 DIAGNOSIS — M5459 Other low back pain: Secondary | ICD-10-CM | POA: Diagnosis present

## 2024-01-20 DIAGNOSIS — M25551 Pain in right hip: Secondary | ICD-10-CM | POA: Insufficient documentation

## 2024-01-20 NOTE — Therapy (Signed)
 OUTPATIENT PHYSICAL THERAPY LOWER EXTREMITY EVALUATION   Patient Name: Christopher Daniel MRN: [id number] DOB:04-25-1951, 73 y.o., male Today's Date: 01/20/2024  END OF SESSION:  PT End of Session - 01/20/24 1235     Visit Number 1    Number of Visits 8    Date for PT Re-Evaluation 02/17/24    PT Start Time 1015    PT Stop Time 1105    PT Time Calculation (min) 50 min    Activity Tolerance Patient tolerated treatment well    Behavior During Therapy WFL for tasks assessed/performed             Past Medical History:  Diagnosis Date   Arthritis    Diabetes mellitus without complication (HCC)    does not take medication or check sugars   GERD (gastroesophageal reflux disease)    Neuromuscular disorder (HCC)    poly neuropathy   Past Surgical History:  Procedure Laterality Date   EYE SURGERY Bilateral    eyelid   HERNIA REPAIR     LUMBAR LAMINECTOMY/DECOMPRESSION MICRODISCECTOMY Right 02/11/2020   Procedure: Laminectomy for facet/synovial cyst - right - Lumbar Five-Sacral One;  Surgeon: Agustina Aldrich, MD;  Location: [facility] OR;  Service: Neurosurgery;  Laterality: Right;  Laminectomy for facet/synovial cyst - right - Lumbar Five-Sacral One   Patient Active Problem List   Diagnosis Date Noted   Synovial cyst of lumbar facet joint 02/11/2020    REFERRING PROVIDER: Janeal Mealing PA  REFERRING DIAG: LBP.  Right hip pain.    THERAPY DIAG:  Pain in right hip - Plan: PT plan of care cert/re-cert  Other low back pain - Plan: PT plan of care cert/re-cert  Rationale for Evaluation and Treatment: Rehabilitation  ONSET DATE: Ongoing but worse over last month.    SUBJECTIVE:   SUBJECTIVE STATEMENT: The patient presents to the clinic with increasing right hip pain over the last month.  He states he has had a h/o LBP but a surgery in 2021 was very helpful.  He states that flexing his hips causes right groin pain.  It is hard for him to putting a sock on his right foot.  His pain is  rated at a 5/10 but can rise to higher levels at times.  He describes the pain as an ache.  He states the pain feels like it is in the joint.    PERTINENT HISTORY: Lumbar laminectomy/microdiscectomy.   PAIN:  Are you having pain? Yes: NPRS scale: 5/10. Pain location: Right hip and groin.   Pain description: Ache.   Aggravating factors: Certain hip movements and bending.   Relieving factors: Rest/not moving hip.  PRECAUTIONS: None  RED FLAGS: None   WEIGHT BEARING RESTRICTIONS: No  FALLS:  Has patient fallen in last 6 months? No  LIVING ENVIRONMENT: Lives with: lives with their spouse Lives in: House/apartment Stairs: Yes. Has following equipment at home: None  OCCUPATION: Retired.  PLOF: Independent  PATIENT GOALS: Put sock on easier on right foot and bend over and pick-up golf ball easier.     OBJECTIVE:  Note: Objective measures were completed at Evaluation unless otherwise noted.   PATIENT SURVEYS:  Modified Oswestry 4/50.    PALPATION: Some pain c/o over right SIJ and pain reported "in" right hip with increased tone over lateral hip musculature.  LOWER EXTREMITY ROM:  Active lumbar flexion limited by 25% and extension to 15 degrees.  Right hip flexion assessed in supine to 100 degrees and passive IR to 10 degrees (  left hip is WNL's).    LOWER EXTREMITY MMT:  Normal right LE strength.  LOWER EXTREMITY SPECIAL TESTS:  Equal leg lengths.  (-) right SLR test.     GAIT: Essentially normal this morning.                                                                                                                                 TREATMENT DATE: 01/20/24:  HMP to low back with IFC at 80-150 Hz at 40% scan to patient's right low back and right lateral hip region f/b PROM/stretching in supine to patient's right hip into flexion and IR with low load long duration stretching technique utilized.  Normal modality response following removal of modality.    PATIENT  EDUCATION:  Education details:  Person educated:  International aid/development worker:  Education comprehension:   HOME EXERCISE PROGRAM:   ASSESSMENT:  CLINICAL IMPRESSION: The patient presents to [facility] with a CC of right hip pain referring into his groin especially with hip flexion and trying to put a sock on.  He has some right SIJ pain.  His right hip range of motion is limited especially into IR.  He demonstrates a negative right SLR test.  His right LE strength is normal.  His Modified Owestry is score is 4/50.  Patient may benefit from skilled physical therapy intervention to address pain and deficits.  OBJECTIVE IMPAIRMENTS: decreased activity tolerance, decreased ROM, and pain.   ACTIVITY LIMITATIONS: bending and dressing  PERSONAL FACTORS: 1 comorbidity: prior lumbar surgery are also affecting patient's functional outcome.   REHAB POTENTIAL: Fair plus/good minus.  CLINICAL DECISION MAKING: Evolving/moderate complexity  EVALUATION COMPLEXITY: Moderate   GOALS:  SHORT TERM GOALS: Target date: 02/17/24.  Ind with a HEP. Goal status: INITIAL  2.  Put sock on right foot with pain not > 2-3/10.  Goal status: INITIAL  3.  Bend over to get Golf ball with pain not > 2-3/10.  Goal status: INITIAL  4.  Perform ADL's with pain not > 3/10.  Goal status: INITIAL  PLAN:  PT FREQUENCY: 2x/week  PT DURATION: 4 weeks  PLANNED INTERVENTIONS: 97110-Therapeutic exercises, 97530- Therapeutic activity, [id number]- Neuromuscular re-education, 97535- Self Care, 16109- Manual therapy, [id number]- Electrical stimulation (unattended), 97035- Ultrasound, Patient/Family education, Balance training, Dry Needling, Joint mobilization, Cryotherapy, and Moist heat  PLAN FOR NEXT SESSION: Recumbent bike, Right hip PROM, hip bridges, core exercise progression.  Modalities and STW/M as needed.     Dionicia Cerritos, [place], PT 01/20/2024, 1:04 PM

## 2024-01-22 ENCOUNTER — Ambulatory Visit

## 2024-01-22 DIAGNOSIS — M25551 Pain in right hip: Secondary | ICD-10-CM

## 2024-01-22 DIAGNOSIS — M5459 Other low back pain: Secondary | ICD-10-CM

## 2024-01-22 NOTE — Therapy (Signed)
 OUTPATIENT PHYSICAL THERAPY LOWER EXTREMITY EVALUATION   Patient Name: Christopher Daniel MRN: [id number] DOB:19-Feb-1951, 73 y.o., male Today's Date: 01/22/2024  END OF SESSION:  PT End of Session - 01/22/24 1428     Visit Number 2    Number of Visits 8    Date for PT Re-Evaluation 02/17/24    PT Start Time 1424    PT Stop Time 1505    PT Time Calculation (min) 41 min    Activity Tolerance Patient tolerated treatment well    Behavior During Therapy WFL for tasks assessed/performed             Past Medical History:  Diagnosis Date   Arthritis    Diabetes mellitus without complication (HCC)    does not take medication or check sugars   GERD (gastroesophageal reflux disease)    Neuromuscular disorder (HCC)    poly neuropathy   Past Surgical History:  Procedure Laterality Date   EYE SURGERY Bilateral    eyelid   HERNIA REPAIR     LUMBAR LAMINECTOMY/DECOMPRESSION MICRODISCECTOMY Right 02/11/2020   Procedure: Laminectomy for facet/synovial cyst - right - Lumbar Five-Sacral One;  Surgeon: Agustina Aldrich, MD;  Location: [facility] OR;  Service: Neurosurgery;  Laterality: Right;  Laminectomy for facet/synovial cyst - right - Lumbar Five-Sacral One   Patient Active Problem List   Diagnosis Date Noted   Synovial cyst of lumbar facet joint 02/11/2020    REFERRING PROVIDER: Janeal Mealing PA  REFERRING DIAG: LBP.  Right hip pain.    THERAPY DIAG:  Pain in right hip  Other low back pain  Rationale for Evaluation and Treatment: Rehabilitation  ONSET DATE: Ongoing but worse over last month.    SUBJECTIVE:   SUBJECTIVE STATEMENT: The patient presents to the clinic with increasing right hip pain over the last month.  He states he has had a h/o LBP but a surgery in 2021 was very helpful.  He states that flexing his hips causes right groin pain.  It is hard for him to putting a sock on his right foot.  His pain is rated at a 5/10 but can rise to higher levels at times.  He describes the  pain as an ache.  He states the pain feels like it is in the joint.    PERTINENT HISTORY: Lumbar laminectomy/microdiscectomy.   PAIN:  Are you having pain? Yes: NPRS scale: 5/10. Pain location: Right hip and groin.   Pain description: Ache.   Aggravating factors: Certain hip movements and bending.   Relieving factors: Rest/not moving hip.  PRECAUTIONS: None  RED FLAGS: None   WEIGHT BEARING RESTRICTIONS: No  FALLS:  Has patient fallen in last 6 months? No  LIVING ENVIRONMENT: Lives with: lives with their spouse Lives in: House/apartment Stairs: Yes. Has following equipment at home: None  OCCUPATION: Retired.  PLOF: Independent  PATIENT GOALS: Put sock on easier on right foot and bend over and pick-up golf ball easier.     OBJECTIVE:  Note: Objective measures were completed at Evaluation unless otherwise noted.   PATIENT SURVEYS:  Modified Oswestry 4/50.    PALPATION: Some pain c/o over right SIJ and pain reported "in" right hip with increased tone over lateral hip musculature.  LOWER EXTREMITY ROM:  Active lumbar flexion limited by 25% and extension to 15 degrees.  Right hip flexion assessed in supine to 100 degrees and passive IR to 10 degrees (left hip is WNL's).    LOWER EXTREMITY MMT:  Normal right LE  strength.  LOWER EXTREMITY SPECIAL TESTS:  Equal leg lengths.  (-) right SLR test.     GAIT: Essentially normal this morning.                                                                                                                                 TREATMENT DATE:    01/22/24                                  EXERCISE LOG  Exercise Repetitions and Resistance Comments  Recumbent Bike Lvl 3 x 15 mins   Thomas Stretch  5 reps x 30 sec hold   Lunges 14" box x 3 mins each way   Modified Figure 4 Stretch 5 reps x 30 sec hold   Golf [employer] 15 reps    Blank cell = exercise not performed today   01/20/24:  HMP to low back with IFC at 80-150 Hz  at 40% scan to patient's right low back and right lateral hip region f/b PROM/stretching in supine to patient's right hip into flexion and IR with low load long duration stretching technique utilized.  Normal modality response following removal of modality.    PATIENT EDUCATION:  Education details:  Person educated:  International aid/development worker:  Education comprehension:   HOME EXERCISE PROGRAM:   ASSESSMENT:  CLINICAL IMPRESSION: Pt arrives for today's treatment session reporting 2/10 right hip pain.  Pt instructed in supine, standing, and seated right hip stretches to increase ROM and function.  Pt also introduced to bil lunges onto 14" box to stretch BLEs.  Pt declined printed HEP.  Discussed with pt proper methods of picking up/retrieving golf balls.  Pt verbalized understanding.  Pt reports mild increase in pain at completion of today's treatment session.   OBJECTIVE IMPAIRMENTS: decreased activity tolerance, decreased ROM, and pain.   ACTIVITY LIMITATIONS: bending and dressing  PERSONAL FACTORS: 1 comorbidity: prior lumbar surgery are also affecting patient's functional outcome.   REHAB POTENTIAL: Fair plus/good minus.  CLINICAL DECISION MAKING: Evolving/moderate complexity  EVALUATION COMPLEXITY: Moderate   GOALS:  SHORT TERM GOALS: Target date: 02/17/24.  Ind with a HEP. Goal status: INITIAL  2.  Put sock on right foot with pain not > 2-3/10.  Goal status: INITIAL  3.  Bend over to get Golf ball with pain not > 2-3/10.  Goal status: INITIAL  4.  Perform ADL's with pain not > 3/10.  Goal status: INITIAL  PLAN:  PT FREQUENCY: 2x/week  PT DURATION: 4 weeks  PLANNED INTERVENTIONS: 97110-Therapeutic exercises, 97530- Therapeutic activity, [id number]- Neuromuscular re-education, 97535- Self Care, [id number]- Manual therapy, [id number]- Electrical stimulation (unattended), 97035- Ultrasound, Patient/Family education, Balance training, Dry Needling, Joint mobilization, Cryotherapy, and Moist  heat  PLAN FOR NEXT SESSION: Recumbent bike, Right hip PROM, hip bridges, core exercise progression.  Modalities and STW/M as needed.  Deryl Flora, PTA 01/22/2024, 3:10 PM

## 2024-01-27 ENCOUNTER — Encounter

## 2024-06-29 ENCOUNTER — Other Ambulatory Visit: Payer: Self-pay | Admitting: Urology

## 2024-06-29 ENCOUNTER — Encounter: Payer: Self-pay | Admitting: Urology

## 2024-06-29 DIAGNOSIS — R972 Elevated prostate specific antigen [PSA]: Secondary | ICD-10-CM

## 2024-07-10 ENCOUNTER — Ambulatory Visit
Admission: RE | Admit: 2024-07-10 | Discharge: 2024-07-10 | Disposition: A | Discharge status: Home / Self Care | Source: Ambulatory Visit | Attending: Urology | Admitting: Urology

## 2024-07-10 DIAGNOSIS — R972 Elevated prostate specific antigen [PSA]: Secondary | ICD-10-CM

## 2024-07-10 MED ORDER — GADOPICLENOL 0.5 MMOL/ML IV SOLN
10.0000 mL | Freq: Once | INTRAVENOUS | Status: AC | PRN
  Administered 2024-07-10: 10 mL via INTRAVENOUS

## 2024-08-02 ENCOUNTER — Other Ambulatory Visit

## 2024-08-11 ENCOUNTER — Other Ambulatory Visit (HOSPITAL_COMMUNITY)
Admission: RE | Admit: 2024-08-11 | Discharge: 2024-08-11 | Disposition: A | Discharge status: Home / Self Care | Source: Ambulatory Visit | Attending: Urology | Admitting: Urology

## 2024-08-11 DIAGNOSIS — R319 Hematuria, unspecified: Secondary | ICD-10-CM | POA: Diagnosis present

## 2024-08-11 LAB — BUN: BUN: 19 mg/dL (ref 8–23)

## 2024-08-11 LAB — CREATININE, SERUM
Creatinine, Ser: 0.87 mg/dL (ref 0.61–1.24)
GFR, Estimated: >60 mL/min
